# Patient Record
Sex: Male | Born: 1935 | Race: Black or African American | Hispanic: No | Marital: Single | Smoking: Former smoker
Health system: Southern US, Community
[De-identification: ages and names within clinical notes are randomized; demographics above are authoritative.]

## PROBLEM LIST (undated history)

## (undated) DIAGNOSIS — E785 Hyperlipidemia, unspecified: Secondary | ICD-10-CM

## (undated) DIAGNOSIS — H9190 Unspecified hearing loss, unspecified ear: Secondary | ICD-10-CM

## (undated) DIAGNOSIS — I10 Essential (primary) hypertension: Secondary | ICD-10-CM

## (undated) DIAGNOSIS — Z87828 Personal history of other (healed) physical injury and trauma: Secondary | ICD-10-CM

## (undated) DIAGNOSIS — M1990 Unspecified osteoarthritis, unspecified site: Secondary | ICD-10-CM

## (undated) DIAGNOSIS — C801 Malignant (primary) neoplasm, unspecified: Secondary | ICD-10-CM

## (undated) DIAGNOSIS — K219 Gastro-esophageal reflux disease without esophagitis: Secondary | ICD-10-CM

## (undated) HISTORY — PX: PROSTATE SURGERY: SHX751

## (undated) HISTORY — PX: COLONOSCOPY: SHX174

## (undated) HISTORY — DX: Gastro-esophageal reflux disease without esophagitis: K21.9

## (undated) HISTORY — DX: Personal history of other (healed) physical injury and trauma: Z87.828

## (undated) HISTORY — DX: Hyperlipidemia, unspecified: E78.5

## (undated) HISTORY — DX: Unspecified osteoarthritis, unspecified site: M19.90

## (undated) HISTORY — DX: Malignant (primary) neoplasm, unspecified: C80.1

## (undated) HISTORY — DX: Essential (primary) hypertension: I10

## (undated) HISTORY — PX: HERNIA REPAIR: SHX51

---

## 2004-01-24 ENCOUNTER — Encounter: Admission: RE | Admit: 2004-01-24 | Discharge: 2004-01-24 | Payer: Self-pay | Admitting: Internal Medicine

## 2004-02-04 ENCOUNTER — Encounter: Admission: RE | Admit: 2004-02-04 | Discharge: 2004-02-04 | Payer: Self-pay | Admitting: Internal Medicine

## 2004-02-08 ENCOUNTER — Encounter: Admission: RE | Admit: 2004-02-08 | Discharge: 2004-02-08 | Payer: Self-pay | Admitting: Internal Medicine

## 2004-02-22 ENCOUNTER — Encounter: Admission: RE | Admit: 2004-02-22 | Discharge: 2004-02-22 | Payer: Self-pay | Admitting: Internal Medicine

## 2004-04-18 ENCOUNTER — Ambulatory Visit (HOSPITAL_COMMUNITY): Admission: RE | Admit: 2004-04-18 | Discharge: 2004-04-18 | Payer: Self-pay | Admitting: *Deleted

## 2004-04-18 ENCOUNTER — Encounter (INDEPENDENT_AMBULATORY_CARE_PROVIDER_SITE_OTHER): Payer: Self-pay | Admitting: Specialist

## 2004-06-17 ENCOUNTER — Encounter: Admission: RE | Admit: 2004-06-17 | Discharge: 2004-06-17 | Payer: Self-pay | Admitting: Internal Medicine

## 2004-07-11 ENCOUNTER — Ambulatory Visit: Payer: Self-pay | Admitting: Internal Medicine

## 2004-08-07 ENCOUNTER — Ambulatory Visit: Payer: Self-pay | Admitting: Internal Medicine

## 2004-11-26 ENCOUNTER — Ambulatory Visit: Payer: Self-pay | Admitting: Internal Medicine

## 2006-03-03 ENCOUNTER — Ambulatory Visit: Payer: Self-pay | Admitting: Internal Medicine

## 2006-09-22 ENCOUNTER — Emergency Department (HOSPITAL_COMMUNITY): Admission: EM | Admit: 2006-09-22 | Discharge: 2006-09-22 | Payer: Self-pay | Admitting: Family Medicine

## 2007-03-04 ENCOUNTER — Telehealth (INDEPENDENT_AMBULATORY_CARE_PROVIDER_SITE_OTHER): Payer: Self-pay | Admitting: *Deleted

## 2007-03-21 ENCOUNTER — Ambulatory Visit: Payer: Self-pay | Admitting: Internal Medicine

## 2007-03-21 ENCOUNTER — Encounter (INDEPENDENT_AMBULATORY_CARE_PROVIDER_SITE_OTHER): Payer: Self-pay | Admitting: Pulmonary Disease

## 2007-03-21 DIAGNOSIS — I10 Essential (primary) hypertension: Secondary | ICD-10-CM | POA: Insufficient documentation

## 2007-03-21 DIAGNOSIS — M159 Polyosteoarthritis, unspecified: Secondary | ICD-10-CM

## 2007-03-21 DIAGNOSIS — D126 Benign neoplasm of colon, unspecified: Secondary | ICD-10-CM

## 2007-03-21 DIAGNOSIS — E785 Hyperlipidemia, unspecified: Secondary | ICD-10-CM

## 2007-03-21 LAB — CONVERTED CEMR LAB
ALT: 19 units/L (ref 0–53)
AST: 18 units/L (ref 0–37)
Albumin: 4.4 g/dL (ref 3.5–5.2)
Alkaline Phosphatase: 48 units/L (ref 39–117)
BUN: 20 mg/dL (ref 6–23)
CO2: 27 meq/L (ref 19–32)
Calcium: 9.9 mg/dL (ref 8.4–10.5)
Chloride: 105 meq/L (ref 96–112)
Cholesterol: 237 mg/dL — ABNORMAL HIGH (ref 0–200)
Creatinine, Ser: 1.1 mg/dL (ref 0.40–1.50)
Glucose, Bld: 77 mg/dL (ref 70–99)
HCT: 41.6 % (ref 39.0–52.0)
HDL: 49 mg/dL (ref >39)
Hemoglobin: 13.7 g/dL (ref 13.0–17.0)
LDL Cholesterol: 146 mg/dL — ABNORMAL HIGH (ref 0–99)
MCHC: 32.9 g/dL (ref 30.0–36.0)
MCV: 93.1 fL (ref 78.0–100.0)
PSA: 6.36 ng/mL — ABNORMAL HIGH (ref 0.10–4.00)
Platelets: 299 10*3/uL (ref 150–400)
Potassium: 4.8 meq/L (ref 3.5–5.3)
RBC: 4.47 M/uL (ref 4.22–5.81)
RDW: 12.7 % (ref 11.5–14.0)
Sodium: 141 meq/L (ref 135–145)
Total Bilirubin: 0.4 mg/dL (ref 0.3–1.2)
Total CHOL/HDL Ratio: 4.8
Total Protein: 7.8 g/dL (ref 6.0–8.3)
Triglycerides: 210 mg/dL — ABNORMAL HIGH (ref <150)
VLDL: 42 mg/dL — ABNORMAL HIGH (ref 0–40)
WBC: 7.1 10*3/uL (ref 4.0–10.5)

## 2008-03-27 ENCOUNTER — Telehealth (INDEPENDENT_AMBULATORY_CARE_PROVIDER_SITE_OTHER): Payer: Self-pay | Admitting: Pharmacy Technician

## 2008-04-12 ENCOUNTER — Encounter: Payer: Self-pay | Admitting: Internal Medicine

## 2008-04-12 ENCOUNTER — Ambulatory Visit: Payer: Self-pay | Admitting: Internal Medicine

## 2008-04-16 DIAGNOSIS — R972 Elevated prostate specific antigen [PSA]: Secondary | ICD-10-CM

## 2008-04-16 LAB — CONVERTED CEMR LAB
ALT: 25 units/L (ref 0–53)
AST: 24 units/L (ref 0–37)
Albumin: 4.1 g/dL (ref 3.5–5.2)
Alkaline Phosphatase: 45 units/L (ref 39–117)
BUN: 19 mg/dL (ref 6–23)
CO2: 25 meq/L (ref 19–32)
Calcium: 9.4 mg/dL (ref 8.4–10.5)
Chloride: 105 meq/L (ref 96–112)
Cholesterol: 247 mg/dL — ABNORMAL HIGH (ref 0–200)
Creatinine, Ser: 1.02 mg/dL (ref 0.40–1.50)
Glucose, Bld: 81 mg/dL (ref 70–99)
HDL: 54 mg/dL (ref >39)
LDL Cholesterol: 174 mg/dL — ABNORMAL HIGH (ref 0–99)
PSA: 6.82 ng/mL — ABNORMAL HIGH (ref 0.10–4.00)
Potassium: 4.6 meq/L (ref 3.5–5.3)
Sodium: 141 meq/L (ref 135–145)
Total Bilirubin: 0.6 mg/dL (ref 0.3–1.2)
Total CHOL/HDL Ratio: 4.6
Total Protein: 7.3 g/dL (ref 6.0–8.3)
Triglycerides: 95 mg/dL (ref <150)
VLDL: 19 mg/dL (ref 0–40)

## 2009-01-08 ENCOUNTER — Encounter: Payer: Self-pay | Admitting: Internal Medicine

## 2009-01-08 ENCOUNTER — Ambulatory Visit: Payer: Self-pay | Admitting: Internal Medicine

## 2009-01-08 ENCOUNTER — Encounter (INDEPENDENT_AMBULATORY_CARE_PROVIDER_SITE_OTHER): Payer: Self-pay | Admitting: *Deleted

## 2009-01-08 DIAGNOSIS — K4090 Unilateral inguinal hernia, without obstruction or gangrene, not specified as recurrent: Secondary | ICD-10-CM | POA: Insufficient documentation

## 2009-01-08 DIAGNOSIS — R42 Dizziness and giddiness: Secondary | ICD-10-CM | POA: Insufficient documentation

## 2009-01-08 LAB — CONVERTED CEMR LAB
ALT: 33 units/L (ref 0–53)
AST: 25 units/L (ref 0–37)
Albumin: 4.2 g/dL (ref 3.5–5.2)
Alkaline Phosphatase: 50 units/L (ref 39–117)
BUN: 24 mg/dL — ABNORMAL HIGH (ref 6–23)
CO2: 23 meq/L (ref 19–32)
Calcium: 9.7 mg/dL (ref 8.4–10.5)
Chloride: 105 meq/L (ref 96–112)
Creatinine, Ser: 1.04 mg/dL (ref 0.40–1.50)
Glucose, Bld: 75 mg/dL (ref 70–99)
Potassium: 4.5 meq/L (ref 3.5–5.3)
Sodium: 142 meq/L (ref 135–145)
TSH: 1.488 microintl units/mL (ref 0.350–4.500)
Total Bilirubin: 0.5 mg/dL (ref 0.3–1.2)
Total Protein: 7.3 g/dL (ref 6.0–8.3)

## 2009-01-17 ENCOUNTER — Encounter: Admission: RE | Admit: 2009-01-17 | Discharge: 2009-01-17 | Payer: Self-pay | Admitting: Surgery

## 2009-01-21 ENCOUNTER — Encounter: Payer: Self-pay | Admitting: Internal Medicine

## 2009-02-06 ENCOUNTER — Encounter: Payer: Self-pay | Admitting: Internal Medicine

## 2009-03-13 ENCOUNTER — Encounter: Payer: Self-pay | Admitting: Internal Medicine

## 2009-06-11 ENCOUNTER — Ambulatory Visit: Payer: Self-pay | Admitting: Internal Medicine

## 2009-06-11 DIAGNOSIS — J309 Allergic rhinitis, unspecified: Secondary | ICD-10-CM | POA: Insufficient documentation

## 2009-06-11 LAB — CONVERTED CEMR LAB
ALT: 26 units/L (ref 0–53)
AST: 22 units/L (ref 0–37)
Albumin: 4.2 g/dL (ref 3.5–5.2)
Alkaline Phosphatase: 44 units/L (ref 39–117)
BUN: 18 mg/dL (ref 6–23)
CO2: 25 meq/L (ref 19–32)
Calcium: 9.3 mg/dL (ref 8.4–10.5)
Chloride: 104 meq/L (ref 96–112)
Cholesterol: 226 mg/dL — ABNORMAL HIGH (ref 0–200)
Creatinine, Ser: 1.19 mg/dL (ref 0.40–1.50)
Glucose, Bld: 65 mg/dL — ABNORMAL LOW (ref 70–99)
HDL: 57 mg/dL (ref >39)
LDL Cholesterol: 146 mg/dL — ABNORMAL HIGH (ref 0–99)
Potassium: 4.3 meq/L (ref 3.5–5.3)
Sodium: 141 meq/L (ref 135–145)
Total Bilirubin: 0.5 mg/dL (ref 0.3–1.2)
Total CHOL/HDL Ratio: 4
Total Protein: 6.9 g/dL (ref 6.0–8.3)
Triglycerides: 115 mg/dL (ref <150)
VLDL: 23 mg/dL (ref 0–40)

## 2009-11-13 ENCOUNTER — Ambulatory Visit: Admission: RE | Admit: 2009-11-13 | Discharge: 2010-02-11 | Payer: Self-pay | Admitting: Radiation Oncology

## 2009-12-04 ENCOUNTER — Telehealth: Payer: Self-pay | Admitting: Internal Medicine

## 2009-12-19 ENCOUNTER — Ambulatory Visit: Payer: Self-pay | Admitting: Internal Medicine

## 2009-12-19 ENCOUNTER — Ambulatory Visit (HOSPITAL_COMMUNITY): Admission: RE | Admit: 2009-12-19 | Discharge: 2009-12-19 | Payer: Self-pay | Admitting: Internal Medicine

## 2009-12-19 DIAGNOSIS — M549 Dorsalgia, unspecified: Secondary | ICD-10-CM | POA: Insufficient documentation

## 2009-12-19 LAB — CONVERTED CEMR LAB
ALT: 15 units/L (ref 0–53)
AST: 15 units/L (ref 0–37)
Albumin: 4.3 g/dL (ref 3.5–5.2)
Alkaline Phosphatase: 44 units/L (ref 39–117)
BUN: 15 mg/dL (ref 6–23)
Bacteria, UA: NONE SEEN
Bilirubin Urine: NEGATIVE
CO2: 28 meq/L (ref 19–32)
Calcium: 9.2 mg/dL (ref 8.4–10.5)
Casts: NONE SEEN /lpf
Chloride: 103 meq/L (ref 96–112)
Cholesterol, target level: 200 mg/dL
Cholesterol: 188 mg/dL (ref 0–200)
Creatinine, Ser: 1.25 mg/dL (ref 0.40–1.50)
Crystals: NONE SEEN
Glucose, Bld: 86 mg/dL (ref 70–99)
HDL goal, serum: 40 mg/dL
HDL: 54 mg/dL (ref >39)
Hemoglobin, Urine: NEGATIVE
Ketones, ur: NEGATIVE mg/dL
LDL Cholesterol: 114 mg/dL — ABNORMAL HIGH (ref 0–99)
LDL Goal: 100 mg/dL
Nitrite: NEGATIVE
Potassium: 4.1 meq/L (ref 3.5–5.3)
Protein, ur: NEGATIVE mg/dL
RBC / HPF: NONE SEEN (ref <3)
Sodium: 141 meq/L (ref 135–145)
Specific Gravity, Urine: 1.01 (ref 1.005–1.0)
Squamous Epithelial / HPF: NONE SEEN /lpf
Total Bilirubin: 0.5 mg/dL (ref 0.3–1.2)
Total CHOL/HDL Ratio: 3.5
Total Protein: 7.2 g/dL (ref 6.0–8.3)
Triglycerides: 99 mg/dL (ref <150)
Urine Glucose: NEGATIVE mg/dL
Urobilinogen, UA: 0.2 (ref 0.0–1.0)
VLDL: 20 mg/dL (ref 0–40)
pH: 6 (ref 5.0–8.0)

## 2010-02-12 ENCOUNTER — Ambulatory Visit: Admission: RE | Admit: 2010-02-12 | Discharge: 2010-04-04 | Payer: Self-pay | Admitting: Radiation Oncology

## 2010-04-29 ENCOUNTER — Telehealth: Payer: Self-pay | Admitting: Internal Medicine

## 2010-05-09 ENCOUNTER — Encounter: Payer: Self-pay | Admitting: Internal Medicine

## 2010-05-09 ENCOUNTER — Telehealth: Payer: Self-pay | Admitting: Internal Medicine

## 2010-06-30 ENCOUNTER — Telehealth: Payer: Self-pay | Admitting: Internal Medicine

## 2010-08-19 ENCOUNTER — Telehealth: Payer: Self-pay | Admitting: Internal Medicine

## 2010-10-20 ENCOUNTER — Telehealth: Payer: Self-pay | Admitting: Internal Medicine

## 2010-10-21 ENCOUNTER — Emergency Department (HOSPITAL_COMMUNITY)
Admission: EM | Admit: 2010-10-21 | Discharge: 2010-10-21 | Payer: Self-pay | Source: Home / Self Care | Admitting: Family Medicine

## 2010-11-20 ENCOUNTER — Ambulatory Visit (HOSPITAL_COMMUNITY)
Admission: RE | Admit: 2010-11-20 | Discharge: 2010-11-20 | Payer: Self-pay | Source: Home / Self Care | Attending: Gastroenterology | Admitting: Gastroenterology

## 2010-11-21 NOTE — Op Note (Signed)
  NAMEMARCELL, [name]                 ACCOUNT NO.:  [id number]  MEDICAL RECORD NO.:  [id number]          PATIENT TYPE:  AMB  LOCATION:  ENDO                         FACILITY:  [facility]  PHYSICIAN:  Shirley Friar, MDDATE OF BIRTH:  [birth year]  DATE OF PROCEDURE:  11/20/2010 DATE OF DISCHARGE:  11/20/2010                              OPERATIVE REPORT   INDICATION:  Rectal bleeding.  MEDICATIONS:  Fentanyl 75 mcg IV, Versed 6 mg IV.  FINDINGS:  Rectal exam was unremarkable.  A pediatric colonoscope was inserted into the rectum where diffuse telangiectasias were seen with oozing of blood upon insertion.  The colonoscope was advanced into the sigmoid colon, which was unremarkable.  The telangiectasias in the rectum were again visualized on pull back into the rectum consistent with radiation proctitis.  Argon plasma coagulation was then used to fulgurate multiple areas of telangiectasias with complete hemostasis. The colonoscope was withdrawn to confirm above findings.  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  PLAN: 1. Avoid NSAIDs. 2. Consider repeat sigmoidoscopy with argon plasma coagulation if     bleeding recurs.     Shirley Friar, MD     VCS/MEDQ  D:  11/20/2010  T:  11/21/2010  Job:  [id number]  cc:   Lyn Hollingshead, MD  Electronically Signed by Charlott Rakes MD on 11/21/2010 10:48:56 AM

## 2010-11-24 ENCOUNTER — Encounter: Payer: Self-pay | Admitting: Urology

## 2010-12-02 NOTE — Progress Notes (Signed)
Summary: refill/ hla  Phone Note Refill Request Message from:  Fax from Pharmacy on December 04, 2009 12:13 PM  Refills Requested: Medication #1:  LISINOPRIL 10 MG TABS Take 1 tablet by mouth once a day   Last Refilled: 12/28 Initial call taken by: Marin Roberts RN,  December 04, 2009 12:14 PM  Follow-up for Phone Call        Refill approved-nurse to complete    Prescriptions: LISINOPRIL 10 MG TABS (LISINOPRIL) Take 1 tablet by mouth once a day  #31 x 5   Entered and Authorized by:   Vassie Loll MD   Signed by:   Vassie Loll MD on 12/04/2009   Method used:   Electronically to        [facility] [id number] (retail)       [address] [place], [place]  [address]       Ph: [phone]       Fax: [phone]   RxID:   [id number]

## 2010-12-02 NOTE — Progress Notes (Signed)
Summary: refill/ hla  Phone Note Refill Request Message from:  Pharmacy on April 29, 2010 3:18 PM  Refills Requested: Medication #1:  PRAVACHOL 20 MG TABS Take 1 tablet by mouth once a day   Dosage confirmed as above?Dosage Confirmed   Last Refilled: 5/11 last visit and labs 12/2009  Initial call taken by: Marin Roberts RN,  April 29, 2010 3:19 PM    Prescriptions: PRAVACHOL 20 MG TABS (PRAVASTATIN SODIUM) Take 1 tablet by mouth once a day  #30 Tablet x 4   Entered by:   Zoila Shutter MD   Authorized by:   Vassie Loll MD   Signed by:   Zoila Shutter MD on 04/29/2010   Method used:   Electronically to        [facility] [id number] (retail)       [address] [place], [place]  [address]       Ph: [phone]       Fax: [phone]   RxID:   [id number]

## 2010-12-02 NOTE — Progress Notes (Signed)
Summary: med refill/gp  Phone Note Refill Request Message from:  Fax from Pharmacy on August 19, 2010 11:05 AM  Refills Requested: Medication #1:  NORVASC 10 MG TABS take 1 tablet o once daily   Last Refilled: 07/11/2010 Last appt. w/labs 12/19/09    Method Requested: Electronic Initial call taken by: Chinita Pester RN,  August 19, 2010 11:05 AM  Follow-up for Phone Call        Refill approved-nurse to complete    Prescriptions: NORVASC 10 MG TABS (AMLODIPINE BESYLATE) take 1 tablet o once daily  #30 x 5   Entered and Authorized by:   Vassie Loll MD   Signed by:   Vassie Loll MD on 08/19/2010   Method used:   Electronically to        [facility] [id number] (retail)       [address] [place], [place]  [address]       Ph: [phone]       Fax: [phone]   RxID:   [id number]

## 2010-12-02 NOTE — Medication Information (Signed)
Summary: Prescription Solution: Denial Notice  Prescription Solution: Denial Notice   Imported By: Shon Hough 05/23/2010 16:47:07  _____________________________________________________________________  External Attachment:    Type:   Image     Comment:   External Document

## 2010-12-02 NOTE — Progress Notes (Signed)
Summary: Prior Authorization- Flector patches  Phone Note Outgoing Call   Call placed by: Angelina Ok RN,  May 09, 2010 3:02 PM Call placed to: Specialist Summary of Call: Call for Prior Authorization of Flector 1.3% patches.  Information given.  Faxed response is expected from the  Insurer. Angelina Ok RN  May 09, 2010 3:03 PM                      Initial call taken by: Angelina Ok RN,  May 09, 2010 3:04 PM  Follow-up for Phone Call        Flector has been denied by medicare. paperwork is in your box for review. Follow-up by: Merrie Roof RN,  May 12, 2010 3:01 PM  Additional Follow-up for Phone Call Additional follow up Details #1::        I will review paperwork and if needed will change his flector patches tu lidocaine patchs or diclofenac patches if approved by medicare and patient continue to be in pain.

## 2010-12-02 NOTE — Progress Notes (Signed)
Summary: Refill/gh  Phone Note Refill Request Message from:  Fax from Pharmacy on June 30, 2010 4:15 PM  Refills Requested: Medication #1:  LISINOPRIL 10 MG TABS Take 1 tablet by mouth once a day   Last Refilled: 05/24/2010  Method Requested: Electronic Initial call taken by: Angelina Ok RN,  June 30, 2010 4:16 PM  Follow-up for Phone Call        Refill approved-nurse to complete    Prescriptions: LISINOPRIL 10 MG TABS (LISINOPRIL) Take 1 tablet by mouth once a day  #31 x 5   Entered and Authorized by:   Vassie Loll MD   Signed by:   Vassie Loll MD on 07/01/2010   Method used:   Electronically to        [facility] [id number] (retail)       [address] [place], [place]  [address]       Ph: [phone]       Fax: [phone]   RxID:   [id number]

## 2010-12-02 NOTE — Assessment & Plan Note (Signed)
Summary: est-back apin/cfb   Vital Signs:  Patient profile:   76 year old male Height:      71.5 inches (181.61 cm) Weight:      179.6 pounds (81.64 kg) BMI:     24.79 Temp:     97.5 degrees F (36.39 degrees C) oral Pulse rate:   74 / minute BP sitting:   136 / 82  (right arm)  Vitals Entered By: Clifford Kidney Ditzler RN (December 19, 2009 10:06 AM) CC: Lipid Management Is Patient Diabetic? No Pain Assessment Patient in pain? yes     Location: left low back Intensity: 10 Type: pain Onset of pain  past 3 days Nutritional Status BMI of 19 -24 = normal Nutritional Status Detail appetite good  Have you ever been in a relationship where you felt threatened, hurt or afraid?denies   Does patient need assistance? Functional Status Self care Ambulation Normal Comments Left low back pain past 3 days - worse with movement. Freq urination recently.   Primary Care Provider:  Vassie Loll MD  CC:  Lipid Management.  History of Present Illness: [name] is a pleasant  76 yo man with PMH of HTN, prostate nodule, inguinal hernia and HLD who presents for regular check-up and to get refill on his BP medications. Pt reports feeling great in general and complaints only of back pain since monday; pain is localized in left lower back, aggravated by movement, relieved by rest,no radiated, no fever and no chills associated. Patient reports noticing increased urination as well, but denies hematuria and dysuria.  Pt following Dr. Vonita Moss regarding elevated PSA and prostate cancer; he is going to start radiation therapy next month.  Regarding his inguinal hernia, he is going to schedule surgery repairment; he has been procrastinating in his hernia repair, because is not causing any pain at this pont.  Pt denies any dizziness, CP, SOB, abdominal pain or any other complaints at this time.  BP is doing great 136/82.  Depression History:      The patient denies a depressed mood most of the day and a  diminished interest in his usual daily activities.        The patient denies that he feels like life is not worth living, denies that he wishes that he were dead, and denies that he has thought about ending his life.         Lipid Management History:      Positive NCEP/ATP III risk factors include male age 74 years old or older and hypertension.  Negative NCEP/ATP III risk factors include no family history for ischemic heart disease, non-tobacco-user status, no ASHD (atherosclerotic heart disease), no prior stroke/TIA, no peripheral vascular disease, and no history of aortic aneurysm.      Preventive Screening-Counseling & Management  Alcohol-Tobacco     Smoking Status: quit     Year Quit: 20 years  Problems Prior to Update: 1)  Back Pain  (ICD-724.5) 2)  Allergic Rhinitis  (ICD-477.9) 3)  Dizziness  (ICD-780.4) 4)  Inguinal Hernia, Left  (ICD-550.90) 5)  Elevated Prostate Specific Antigen  (ICD-790.93) 6)  Screening For Malignant Neoplasm, Prostate  (ICD-V76.44) 7)  Aftercare, Long-term Use, Medications Nec  (ICD-V58.69) 8)  Colonic Polyps  (ICD-211.3) 9)  Osteoarthrosis, Generalized, Multiple Sites  (ICD-715.09) 10)  Hyperlipidemia  (ICD-272.4) 11)  Essential Hypertension  (ICD-401.9)  Current Problems (verified): 1)  Allergic Rhinitis  (ICD-477.9) 2)  Dizziness  (ICD-780.4) 3)  Inguinal Hernia, Left  (ICD-550.90) 4)  Elevated Prostate  Specific Antigen  (ICD-790.93) 5)  Screening For Malignant Neoplasm, Prostate  (ICD-V76.44) 6)  Aftercare, Long-term Use, Medications Nec  (ICD-V58.69) 7)  Colonic Polyps  (ICD-211.3) 8)  Osteoarthrosis, Generalized, Multiple Sites  (ICD-715.09) 9)  Hyperlipidemia  (ICD-272.4) 10)  Essential Hypertension  (ICD-401.9)  Medications Prior to Update: 1)  Norvasc 10 Mg Tabs (Amlodipine Besylate) .... Take 1 Tablet O Once Daily 2)  Ibuprofen 200 Mg Tabs (Ibuprofen) .... Take 1 Tablet By Mouth Three Times A Day As Needed For Knee and Back Pain 3)   Aspirin 81 Mg Tbec (Aspirin) .... Take 1 Tablet By Mouth Once Daily 4)  Lisinopril 10 Mg Tabs (Lisinopril) .... Take 1 Tablet By Mouth Once A Day 5)  Pravachol 20 Mg Tabs (Pravastatin Sodium) .... Take 1 Tablet By Mouth Once A Day 6)  Loratadine 10 Mg Tabs (Loratadine) .... Take 1 Tablet By Mouth Once A Day  Current Medications (verified): 1)  Norvasc 10 Mg Tabs (Amlodipine Besylate) .... Take 1 Tablet O Once Daily 2)  Aspirin 81 Mg Tbec (Aspirin) .... Take 1 Tablet By Mouth Once Daily 3)  Lisinopril 10 Mg Tabs (Lisinopril) .... Take 1 Tablet By Mouth Once A Day 4)  Pravachol 20 Mg Tabs (Pravastatin Sodium) .... Take 1 Tablet By Mouth Once A Day 5)  Loratadine 10 Mg Tabs (Loratadine) .... Take 1 Tablet By Mouth Once A Day  Allergies (verified): No Known Drug Allergies  Past History:  Past Medical History: Last updated: 04/12/2008 HTN HLD Osteoarthrosis  Social History: Last updated: 04/12/2008 Retired Single Former Smoker Alcohol use-no Drug use-no  Risk Factors: Smoking Status: quit (12/19/2009)  Review of Systems       As per HPi.  Physical Exam  General:  Well-developed,well-nourished, in no acute distress; alert, appropriate and cooperative throughout examination. Lungs:  normal respiratory effort, no crackles, and no wheezes.   Heart:  normal rate, regular rhythm, no murmur, no gallop, and no rub.   Abdomen:  Bowel sounds positive, abdomen soft and non-tender without masses and organomegaly. Msk:  Left thoracic back tenderness with palpation; negative SLR test bilaterally; no crepitation; no joint swelling. Extremities:  No clubbing, cyanosis, edema, or deformity noted. Neurologic:  alert & oriented X3 and cranial nerves II-XII fully intact.  strength normal in all extremities, sensation intact to light touch, sensation intact to pinprick, and finger-to-nose normal.     Impression & Recommendations:  Problem # 1:  BACK PAIN (ICD-724.5) Patient back pain most  likely secondary to muscle sprain; because of his hx of prostate cancer an x-ray (which was negative) was done to r/o metastasis and at the same time r/o any fractures. Also checked urine to r/o UTI. conservative treatment wasinitidatedwith diclofenac and flexeril; I have discussed use of moist heat or ice, modified activities, medications, and stretching/strengthening exercises. Back care instructions given. To be seen in 2 weeks if no improvement; sooner if worsening of symptoms.   The following medications were removed from the medication list:    Ibuprofen 200 Mg Tabs (Ibuprofen) .Marland Kitchen... Take 1 tablet by mouth three times a day as needed for knee and back pain His updated medication list for this problem includes:    Aspirin 81 Mg Tbec (Aspirin) .Marland Kitchen... Take 1 tablet by mouth once daily    Diclofenac Sodium 75 Mg Tbec (Diclofenac sodium) .Marland Kitchen... Take 1 tablet by mouth two times a day    Flexeril 5 Mg Tabs (Cyclobenzaprine hcl) .Marland Kitchen... Take 1 tab by mouth at bedtime  Orders:  Diagnostic X-Ray/Fluoroscopy (Diagnostic X-Ray/Flu) Diagnostic X-Ray/Fluoroscopy (Diagnostic X-Ray/Flu) T-Culture, Urine ([id number]) T-Urinalysis ([id number])  Problem # 2:  ELEVATED PROSTATE SPECIFIC ANTIGEN (ICD-790.93) Patient will received radiotherapy for his prostate cancer and will continue close followup by his urologist Dr. Vonita Moss.  Problem # 3:  ESSENTIAL HYPERTENSION (ICD-401.9) At goal and well controlled. Will continue current regimen and will advised to follow a low sodium diet. Renal function and electrolytes were checked and were WNL.  His updated medication list for this problem includes:    Norvasc 10 Mg Tabs (Amlodipine besylate) .Marland Kitchen... Take 1 tablet o once daily    Lisinopril 10 Mg Tabs (Lisinopril) .Marland Kitchen... Take 1 tablet by mouth once a day  BP today: 136/82 Prior BP: 145/80 (06/11/2009)  10 Yr Risk Heart Disease: 22 %  Labs Reviewed: K+: 4.3 (06/11/2009) Creat: : 1.19 (06/11/2009)   Chol: 226  (06/11/2009)   HDL: 57 (06/11/2009)   LDL: 146 (06/11/2009)   TG: 115 (06/11/2009)  Problem # 4:  HYPERLIPIDEMIA (ICD-272.4) Lipid profile was done and demonstrated LDL of 114; will continue pravachol same dose and will advised patient to continue low fat diet. LFT's WNL.  His updated medication list for this problem includes:    Pravachol 20 Mg Tabs (Pravastatin sodium) .Marland Kitchen... Take 1 tablet by mouth once a day  Orders: T-Lipid Profile [phone])  Labs Reviewed: SGOT: 22 (06/11/2009)   SGPT: 26 (06/11/2009)  Lipid Goals: Chol Goal: 200 (12/19/2009)   HDL Goal: 40 (12/19/2009)   LDL Goal: 100 (12/19/2009)   TG Goal: 150 (12/19/2009)  10 Yr Risk Heart Disease: 22 %   HDL:57 (06/11/2009), 54 (04/12/2008)  LDL:146 (06/11/2009), 174 (04/12/2008)  Chol:226 (06/11/2009), 247 (04/12/2008)  Trig:115 (06/11/2009), 95 (04/12/2008)  Complete Medication List: 1)  Norvasc 10 Mg Tabs (Amlodipine besylate) .... Take 1 tablet o once daily 2)  Aspirin 81 Mg Tbec (Aspirin) .... Take 1 tablet by mouth once daily 3)  Lisinopril 10 Mg Tabs (Lisinopril) .... Take 1 tablet by mouth once a day 4)  Pravachol 20 Mg Tabs (Pravastatin sodium) .... Take 1 tablet by mouth once a day 5)  Loratadine 10 Mg Tabs (Loratadine) .... Take 1 tablet by mouth once a day 6)  Diclofenac Sodium 75 Mg Tbec (Diclofenac sodium) .... Take 1 tablet by mouth two times a day 7)  Flexeril 5 Mg Tabs (Cyclobenzaprine hcl) .... Take 1 tab by mouth at bedtime 8)  Flector 1.3 % Ptch (Diclofenac epolamine) .... Apply 1 patch in the affected area two times a day  Other Orders: Influenza Vaccine MCR ([address]) Tdap => 63yrs IM ([address]) T-Comprehensive Metabolic Panel ([id number])  Lipid Assessment/Plan:      Based on NCEP/ATP III, the patient's risk factor category is "0-1 risk factors".  The patient's lipid goals are as follows: Total cholesterol goal is 200; LDL cholesterol goal is 100; HDL cholesterol goal is 40; Triglyceride goal is 150.     Patient Instructions: 1)  Please schedule a follow-up appointment in 3 months. 2)  Remember to be compliant with your appointment at [facility] with Dr. Vonita Moss. 3)  Limit your Sodium (less than 2 grams daily). 4)  Take your medications as prescribed. 5)  Most patients (90%) with low back pain will improve with time (2-6 weeks). Keep active but avoid activities that are painful. Apply moist heat and/or ice to lower back several times a day. 6)  You will be called with any abnormalities in the tests scheduled or performed today.  If you don't  hear from [place] within a week from when the test was performed, you can assume that your test was normal. Prescriptions: NORVASC 10 MG TABS (AMLODIPINE BESYLATE) take 1 tablet o once daily  #30 x 5   Entered and Authorized by:   Vassie Loll MD   Signed by:   Vassie Loll MD on 12/19/2009   Method used:   Electronically to        [facility] [address] (retail)       [address] [place], [place]  [address]       Ph: [phone]       Fax: [phone]   RxID:   [id number] FLECTOR 1.3 % PTCH (DICLOFENAC EPOLAMINE) Apply 1 patch in the affected area two times a day  #1 x 1   Entered and Authorized by:   Vassie Loll MD   Signed by:   Vassie Loll MD on 12/19/2009   Method used:   Electronically to        [facility] [address] (retail)       [address]       [place], [place]  [address]       Ph: [phone]       Fax: [phone]   RxID:   [id number] FLEXERIL 5 MG TABS (CYCLOBENZAPRINE HCL) Take 1 tab by mouth at bedtime  #15 x 0   Entered and Authorized by:   Vassie Loll MD   Signed by:   Vassie Loll MD on 12/19/2009   Method used:   Electronically to        [facility] [address] (retail)       [address]       [place], [place]  [address]       Ph: [phone]       Fax: [phone]   RxID:    [id number] DICLOFENAC SODIUM 75 MG TBEC (DICLOFENAC SODIUM) Take 1 tablet by mouth two times a day  #30 x 0   Entered and Authorized by:   Vassie Loll MD   Signed by:   Vassie Loll MD on 12/19/2009   Method used:   Electronically to        [facility] [address] (retail)       [address] [place], [place]  [address]       Ph: [phone]       Fax: [phone]   RxID:   [id number]  Process Orders Check Orders Results:     [facility]: Check successful Tests Sent for requisitioning (December 24, 2009 8:14 AM):     12/19/2009: [facility] -- T-Culture, Urine [[id number]] (signed)     12/19/2009: [facility] -- T-Lipid Profile [phone] (signed)     12/19/2009: [facility] -- T-Comprehensive Metabolic Panel [[id number]] (signed)     12/19/2009: [facility] -- T-Urinalysis [[id number]] (signed)    Prevention & Chronic Care Immunizations   Influenza vaccine: Fluvax MCR  (12/19/2009)   Influenza vaccine deferral: Deferred  (06/11/2009)   Influenza vaccine due: 07/03/2010    Tetanus booster: 12/19/2009: Tdap   Td booster deferral: Deferred  (06/11/2009)   Tetanus booster due: 12/20/2019  Pneumococcal vaccine: Not documented   Pneumococcal vaccine deferral: Deferred  (06/11/2009)    H. zoster vaccine: Not documented   H. zoster vaccine deferral: Deferred  (06/11/2009)  Colorectal Screening   Hemoccult: Not documented   Hemoccult action/deferral: Ordered  (06/11/2009)   Hemoccult due: 06/11/2010    Colonoscopy: Not documented   Colonoscopy action/deferral: Deferred  (12/19/2009)  Other Screening   PSA: 6.82  (04/12/2008)  Reports requested:   Last colonoscopy report requested.  Smoking status: quit  (12/19/2009)  Lipids   Total Cholesterol: 226  (06/11/2009)   Lipid panel action/deferral: Lipid Panel ordered   LDL: 146   (06/11/2009)   LDL Direct: Not documented   HDL: 57  (06/11/2009)   Triglycerides: 115  (06/11/2009)    SGOT (AST): 22  (06/11/2009)   BMP action: Ordered   SGPT (ALT): 26  (06/11/2009) CMP ordered    Alkaline phosphatase: 44  (06/11/2009)   Total bilirubin: 0.5  (06/11/2009)    Lipid flowsheet reviewed?: Yes   Progress toward LDL goal: Improved  Hypertension   Last Blood Pressure: 136 / 82  (12/19/2009)   Serum creatinine: 1.19  (06/11/2009)   BMP action: Ordered   Serum potassium 4.3  (06/11/2009) CMP ordered     Hypertension flowsheet reviewed?: Yes   Progress toward BP goal: Improved  Self-Management Support :   Personal Goals (by the next clinic visit) :      Personal blood pressure goal: 140/90  (12/19/2009)     Personal LDL goal: 100  (12/19/2009)    Patient will work on the following items until the next clinic visit to reach self-care goals:     Medications and monitoring: take my medicines every day, check my blood pressure, bring all of my medications to every visit  (12/19/2009)     Eating: drink diet soda or water instead of juice or soda, eat more vegetables, use fresh or frozen vegetables, eat foods that are low in salt, eat baked foods instead of fried foods, eat fruit for snacks and desserts, limit or avoid alcohol  (12/19/2009)     Activity: take a 30 minute walk every day, park at the far end of the parking lot  (12/19/2009)    Hypertension self-management support: Written self-care plan  (12/19/2009)   Hypertension self-care plan printed.    Lipid self-management support: Written self-care plan  (12/19/2009)   Lipid self-care plan printed.   Nursing Instructions: Give Flu vaccine today Give tetanus booster today Request report of last colonoscopy    Tetanus/Td Vaccine    Vaccine Type: Tdap    Site: right deltoid    Mfr: GlaxoSmithKline    Dose: 0.5 ml    Route: IM    Given by: Clifford Kidney Ditzler RN    Exp. Date: 12/28/2011    Lot #:  [id number]    VIS given: 09/20/07 version given December 19, 2009.  Influenza Vaccine    Vaccine Type: Fluvax MCR    Site: left deltoid    Mfr: novartis    Dose: 0.5 ml    Route: IM    Given by: Clifford Kidney Ditzler RN    Exp. Date: 12/31/2009    Lot #: [id number]    VIS given: 05/26/07 version given December 19, 2009.  Flu Vaccine Consent Questions    Do you have a history of severe allergic reactions to this vaccine? no    Any prior history of allergic reactions to egg and/or gelatin? no    Do you have  a sensitivity to the preservative Thimersol? no    Do you have a past history of Guillan-Barre Syndrome? no    Do you currently have an acute febrile illness? no    Have you ever had a severe reaction to latex? no    Vaccine information given and explained to patient? yes

## 2010-12-04 NOTE — Progress Notes (Signed)
Summary: Blood in urine  Phone Note Call from Patient   Caller: Patient Call For: Lyn Hollingshead Summary of Call: Call from pt'sgirlfriend pt is having some blood in his urine.  had blood in his stool recently.  Small amount.  P t's girlfriend was advised to take pt to Urgent Care today. No appointments in the clinics were available today.  Girlfriend agreed to take pt to the Urgent care for the blood in his urine and stool last week.  Once seen pt will get a follow up appointment in the Clinics. Angelina Ok RN  October 20, 2010 4:41 PM  Initial call taken by: Angelina Ok RN,  October 20, 2010 4:41 PM  Follow-up for Phone Call        agree needs eval. Follow-up by: Julaine Fusi  DO,  October 21, 2010 4:34 PM

## 2010-12-17 ENCOUNTER — Ambulatory Visit (HOSPITAL_COMMUNITY)
Admission: RE | Admit: 2010-12-17 | Discharge: 2010-12-17 | Disposition: A | Discharge status: Home / Self Care | Payer: PRIVATE HEALTH INSURANCE | Source: Ambulatory Visit | Attending: Gastroenterology | Admitting: Gastroenterology

## 2010-12-17 DIAGNOSIS — K625 Hemorrhage of anus and rectum: Secondary | ICD-10-CM | POA: Insufficient documentation

## 2010-12-17 DIAGNOSIS — K6289 Other specified diseases of anus and rectum: Secondary | ICD-10-CM | POA: Insufficient documentation

## 2010-12-17 DIAGNOSIS — Y842 Radiological procedure and radiotherapy as the cause of abnormal reaction of the patient, or of later complication, without mention of misadventure at the time of the procedure: Secondary | ICD-10-CM | POA: Insufficient documentation

## 2010-12-17 DIAGNOSIS — E785 Hyperlipidemia, unspecified: Secondary | ICD-10-CM | POA: Insufficient documentation

## 2010-12-17 DIAGNOSIS — I789 Disease of capillaries, unspecified: Secondary | ICD-10-CM | POA: Insufficient documentation

## 2010-12-17 DIAGNOSIS — I10 Essential (primary) hypertension: Secondary | ICD-10-CM | POA: Insufficient documentation

## 2010-12-17 DIAGNOSIS — Z8546 Personal history of malignant neoplasm of prostate: Secondary | ICD-10-CM | POA: Insufficient documentation

## 2011-01-12 LAB — POCT I-STAT, CHEM 8
BUN: 19 mg/dL (ref 6–23)
Calcium, Ion: 1.25 mmol/L (ref 1.12–1.32)
Chloride: 105 mEq/L (ref 96–112)
Creatinine, Ser: 1.4 mg/dL (ref 0.4–1.5)
Glucose, Bld: 87 mg/dL (ref 70–99)
HCT: 41 % (ref 39.0–52.0)
Hemoglobin: 13.9 g/dL (ref 13.0–17.0)
Potassium: 4.3 mEq/L (ref 3.5–5.1)
Sodium: 142 mEq/L (ref 135–145)
TCO2: 30 mmol/L (ref 0–100)

## 2011-01-12 LAB — POCT URINALYSIS DIPSTICK
Bilirubin Urine: NEGATIVE
Glucose, UA: NEGATIVE mg/dL
Ketones, ur: NEGATIVE mg/dL
Nitrite: NEGATIVE
Protein, ur: NEGATIVE mg/dL
Specific Gravity, Urine: 1.02 (ref 1.005–1.030)
Urobilinogen, UA: 1 mg/dL (ref 0.0–1.0)
pH: 6.5 (ref 5.0–8.0)

## 2011-01-12 LAB — HEMOCCULT GUIAC POC 1CARD (OFFICE): Fecal Occult Bld: POSITIVE

## 2011-01-14 ENCOUNTER — Encounter: Payer: Self-pay | Admitting: Internal Medicine

## 2011-01-20 NOTE — Op Note (Signed)
  [name], [name] NO.:  [id number]  MEDICAL RECORD NO.:  [id number]           PATIENT TYPE:  O  LOCATION:  [facility]                         FACILITY:  [facility]  PHYSICIAN:  Shirley Friar, MDDATE OF BIRTH:  [birth year]  DATE OF PROCEDURE: DATE OF DISCHARGE:  12/17/2010                              OPERATIVE REPORT   PROCEDURE:  Flexible sigmoidoscopy.  INDICATIONS:  Rectal bleeding, history of radiation proctitis, argon plasma coagulation in January, who has had recent recurrence of rectal bleeding.  MEDICATIONS:  Fentanyl 87.5 mcg IV and Versed 8 mg IV.  FINDINGS:  Rectal exam was unremarkable.  A pediatric colonoscope was inserted into an adequately prepped rectum and advanced to the sigmoid colon.  Upon insertion in the rectum, there were scattered areas of superficial bleeding with scattered telangiectasias.  The appearance was less inflamed than it was in January.  Argon plasma coagulation was used to fulgurate the proctitis areas with focus on the areas, that were friable and easily caused bleeding.  A straight fire tip APC probe was initially used and this was changed to a circumferential tip based on the location of the telangiectasias.  Adequate fulguration was done of the radiation proctitis.  PLAN: 1. Avoid NSAIDs. 2. Follow up as needed.  If bleeding recurs, he may need a series of     repeated APC sessions depending on his symptoms.     Shirley Friar, MD     VCS/MEDQ  D:  12/17/2010  T:  12/18/2010  Job:  [id number]  cc:   Dr. Donetta Potts  Electronically Signed by Charlott Rakes MD on 01/20/2011 02:57:36 PM

## 2011-01-21 ENCOUNTER — Other Ambulatory Visit: Payer: Self-pay | Admitting: *Deleted

## 2011-01-21 MED ORDER — LISINOPRIL 10 MG PO TABS: 10.0000 mg | ORAL_TABLET | Freq: Every day | ORAL | Status: DC

## 2011-01-21 NOTE — Telephone Encounter (Signed)
As far as I can tell, this patient has not been seen in over 1 year and has no labs or appointments pending. What is his status with [place]?

## 2011-01-21 NOTE — Telephone Encounter (Signed)
He is still a pt here at the clinic; I will send Chilon a message to schedule him for an appt.

## 2011-01-27 ENCOUNTER — Encounter: Payer: Self-pay | Admitting: Internal Medicine

## 2011-01-27 ENCOUNTER — Ambulatory Visit (INDEPENDENT_AMBULATORY_CARE_PROVIDER_SITE_OTHER): Payer: PRIVATE HEALTH INSURANCE | Admitting: Internal Medicine

## 2011-01-27 DIAGNOSIS — C61 Malignant neoplasm of prostate: Secondary | ICD-10-CM | POA: Insufficient documentation

## 2011-01-27 DIAGNOSIS — Z8546 Personal history of malignant neoplasm of prostate: Secondary | ICD-10-CM | POA: Insufficient documentation

## 2011-01-27 DIAGNOSIS — I10 Essential (primary) hypertension: Secondary | ICD-10-CM

## 2011-01-27 DIAGNOSIS — K4090 Unilateral inguinal hernia, without obstruction or gangrene, not specified as recurrent: Secondary | ICD-10-CM

## 2011-01-27 DIAGNOSIS — E785 Hyperlipidemia, unspecified: Secondary | ICD-10-CM

## 2011-01-27 DIAGNOSIS — D126 Benign neoplasm of colon, unspecified: Secondary | ICD-10-CM

## 2011-01-27 LAB — COMPREHENSIVE METABOLIC PANEL
ALT: 21 U/L (ref 0–53)
AST: 20 U/L (ref 0–37)
Albumin: 4.1 g/dL (ref 3.5–5.2)
Alkaline Phosphatase: 57 U/L (ref 39–117)
BUN: 14 mg/dL (ref 6–23)
CO2: 27 mEq/L (ref 19–32)
Calcium: 9.2 mg/dL (ref 8.4–10.5)
Chloride: 104 mEq/L (ref 96–112)
Creat: 1.08 mg/dL (ref 0.40–1.50)
Glucose, Bld: 79 mg/dL (ref 70–99)
Potassium: 4.3 mEq/L (ref 3.5–5.3)
Sodium: 141 mEq/L (ref 135–145)
Total Bilirubin: 0.4 mg/dL (ref 0.3–1.2)
Total Protein: 6.7 g/dL (ref 6.0–8.3)

## 2011-01-27 LAB — LIPID PANEL
Cholesterol: 185 mg/dL (ref 0–200)
HDL: 61 mg/dL (ref >39)
LDL Cholesterol: 111 mg/dL — ABNORMAL HIGH (ref 0–99)
Total CHOL/HDL Ratio: 3 Ratio
Triglycerides: 63 mg/dL (ref <150)
VLDL: 13 mg/dL (ref 0–40)

## 2011-01-27 LAB — PSA: PSA: 1.34 ng/mL (ref <=4.00)

## 2011-01-27 MED ORDER — AMLODIPINE BESYLATE 10 MG PO TABS: 10.0000 mg | ORAL_TABLET | Freq: Every day | ORAL | Status: DC

## 2011-01-27 MED ORDER — PRAVASTATIN SODIUM 20 MG PO TABS: 20.0000 mg | ORAL_TABLET | Freq: Every day | ORAL | Status: DC

## 2011-01-27 MED ORDER — LISINOPRIL 10 MG PO TABS: 10.0000 mg | ORAL_TABLET | Freq: Every day | ORAL | Status: DC

## 2011-01-27 NOTE — Progress Notes (Signed)
  Subjective:    Patient ID: Lilburn Straw, male    DOB: 1935/05/09, 76 y.o.   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  Last labs done (here, anyway) one year ago.  Review of Systems As above    Objective:   Physical Exam Heent normal  Neck no LAD Lungs CTA Car: RRR Abd-large left inguinal hernia-reduced with some difficulty Ext-normal      Assessment & Plan:   History HTN-check CMET continue medications Inguinal hernia-he will "think about" elective repair Advised on Si/Sx of incarceration/strangulation and risks of that Recheck lipid profile today

## 2011-01-28 LAB — CBC
HCT: 36.9 % — ABNORMAL LOW (ref 39.0–52.0)
Hemoglobin: 11.6 g/dL — ABNORMAL LOW (ref 13.0–17.0)
MCH: 30.2 pg (ref 26.0–34.0)
MCHC: 31.4 g/dL (ref 30.0–36.0)
MCV: 96.1 fL (ref 78.0–100.0)
Platelets: 240 10*3/uL (ref 150–400)
RBC: 3.84 MIL/uL — ABNORMAL LOW (ref 4.22–5.81)
RDW: 12.7 % (ref 11.5–15.5)
WBC: 4.4 10*3/uL (ref 4.0–10.5)

## 2011-03-24 ENCOUNTER — Ambulatory Visit (INDEPENDENT_AMBULATORY_CARE_PROVIDER_SITE_OTHER): Payer: PRIVATE HEALTH INSURANCE | Admitting: Internal Medicine

## 2011-03-24 ENCOUNTER — Encounter: Payer: Self-pay | Admitting: Internal Medicine

## 2011-03-24 VITALS — BP 154/78 | HR 69 | Temp 97.4°F | Resp 20 | Ht 68.0 in | Wt 181.7 lb

## 2011-03-24 DIAGNOSIS — R319 Hematuria, unspecified: Secondary | ICD-10-CM | POA: Insufficient documentation

## 2011-03-24 DIAGNOSIS — R300 Dysuria: Secondary | ICD-10-CM | POA: Insufficient documentation

## 2011-03-24 DIAGNOSIS — K4090 Unilateral inguinal hernia, without obstruction or gangrene, not specified as recurrent: Secondary | ICD-10-CM

## 2011-03-24 LAB — URINALYSIS
Bilirubin Urine: NEGATIVE
Glucose, UA: NEGATIVE mg/dL
Ketones, ur: NEGATIVE mg/dL
Nitrite: NEGATIVE
Protein, ur: 100 mg/dL — AB
Specific Gravity, Urine: 1.02 (ref 1.005–1.030)
Urobilinogen, UA: 0.2 mg/dL (ref 0.0–1.0)
pH: 5.5 (ref 5.0–8.0)

## 2011-03-24 LAB — POCT URINALYSIS DIPSTICK
Bilirubin, UA: NEGATIVE
Glucose, UA: NEGATIVE
Ketones, UA: NEGATIVE
Nitrite, UA: NEGATIVE
Protein, UA: 100
Spec Grav, UA: >=1.03
Urobilinogen, UA: 0.2
pH, UA: 5

## 2011-03-24 MED ORDER — CIPROFLOXACIN HCL 500 MG PO TABS: 500.0000 mg | ORAL_TABLET | Freq: Two times a day (BID) | ORAL | Status: DC

## 2011-03-24 NOTE — Patient Instructions (Signed)
Please make a followup appointment with Dr. Allena Katz on next Wednesday or Thursday. Please take the antibiotic ciprofloxacin 500 mg tablets 2 times daily for next 10 days. Please continue taking all the medications regularly.

## 2011-03-24 NOTE — Assessment & Plan Note (Signed)
He is ready to get evaluated for surgery and so we'll refer him to surgery next visit for elective surgical repair evaluation of his left inguinal hernia

## 2011-03-24 NOTE — Progress Notes (Signed)
  Subjective:    Patient ID: [name], male    DOB: [birth year], 76 y.o.   MRN: [id number]  HPI [name] is a pleasant 76 year old man with a past with history of prostate cancer status post radiation therapy completed 6-8 months before, hypertension who comes to the clinic with chief complaint of blood in urine for past 2 weeks. Says that the blood started suddenly and was in the initial stream and then faded slowly at the end of urination. During the first week he had red urine but then for the second week it turned pinkish. He still notices faint blood in the urine. Here for complaints of burning and soreness in his hernia(left inguinal), during urination for these past 2 weeks. Denies any fever, chills, nausea, vomiting, abdominal pain, chest pain, short of breath, back pain. Also complains of blood in the stool but says that he's been told by the oncologist that he will have it for about a year after completion of radiation.   Review of Systems    as per history of present illness Objective:   Physical Exam    Constitutional: Vital signs reviewed.  Patient is a well-developed and well-nourished in no acute distress and cooperative with exam. Alert and oriented x3.  Head: Normocephalic and atraumatic Mouth: no erythema or exudates, MMM Eyes: PERRL, EOMI, conjunctivae normal, No scleral icterus.  Neck: Supple, Trachea midline normal ROM, No JVD Cardiovascular: RRR, S1 normal, S2 normal, no MRG Pulmonary/Chest: CTAB, no wheezes, rales, or rhonchi Abdominal: Soft. Non-tender, non-distended, bowel sounds are normal, no masses, organomegaly, or guarding present. Large reducible left inguinal hernia, no tenderness. GU: no CVA tenderness Musculoskeletal: No joint deformities, erythema, or stiffness, ROM full and no nontender Neurological: A&O x3, Strenght is normal and symmetric bilaterally, cranial nerve II-XII are grossly intact, no focal motor deficit, sensory intact to light touch  bilaterally.  Skin: Warm, dry and intact. No rash, cyanosis, or clubbing.       Assessment & Plan:

## 2011-03-24 NOTE — Assessment & Plan Note (Signed)
hematuria since last [redacted] weeks along with dysuria and soreness in hernia site. Dipstick urine shows large blood and trace leukocytes. Considering his recent radiation therapy for prostate cancer, he has high likelihood of hematuria due to radiation injury, but can also have prostatitis versus UTI. Will collect urine for UA and culture and will empirically treat with ciprofloxacin 500 mg by mouth twice a day for 10 days. I Will see him back on next Wednesday or Thursday and if he still having symptoms will refer him to urology for further evaluation.

## 2011-03-26 LAB — URINE CULTURE
Colony Count: NO GROWTH
Organism ID, Bacteria: NO GROWTH

## 2011-03-30 ENCOUNTER — Encounter: Payer: Self-pay | Admitting: Internal Medicine

## 2011-04-01 ENCOUNTER — Ambulatory Visit (INDEPENDENT_AMBULATORY_CARE_PROVIDER_SITE_OTHER): Payer: PRIVATE HEALTH INSURANCE | Admitting: Internal Medicine

## 2011-04-01 ENCOUNTER — Encounter: Payer: Self-pay | Admitting: Internal Medicine

## 2011-04-01 VITALS — BP 140/72 | HR 58 | Temp 98.5°F | Ht 71.5 in | Wt 184.0 lb

## 2011-04-01 DIAGNOSIS — K4090 Unilateral inguinal hernia, without obstruction or gangrene, not specified as recurrent: Secondary | ICD-10-CM

## 2011-04-01 DIAGNOSIS — R319 Hematuria, unspecified: Secondary | ICD-10-CM

## 2011-04-01 LAB — CBC WITH DIFFERENTIAL/PLATELET
Basophils Absolute: 0 10*3/uL (ref 0.0–0.1)
Basophils Relative: 0 % (ref 0–1)
Eosinophils Absolute: 0.2 10*3/uL (ref 0.0–0.7)
Eosinophils Relative: 4 % (ref 0–5)
HCT: 34.2 % — ABNORMAL LOW (ref 39.0–52.0)
Hemoglobin: 11.4 g/dL — ABNORMAL LOW (ref 13.0–17.0)
Lymphocytes Relative: 26 % (ref 12–46)
Lymphs Abs: 1.6 10*3/uL (ref 0.7–4.0)
MCH: 30.6 pg (ref 26.0–34.0)
MCHC: 33.3 g/dL (ref 30.0–36.0)
MCV: 91.7 fL (ref 78.0–100.0)
Monocytes Absolute: 0.8 10*3/uL (ref 0.1–1.0)
Monocytes Relative: 14 % — ABNORMAL HIGH (ref 3–12)
Neutro Abs: 3.4 10*3/uL (ref 1.7–7.7)
Neutrophils Relative %: 57 % (ref 43–77)
Platelets: 244 10*3/uL (ref 150–400)
RBC: 3.73 MIL/uL — ABNORMAL LOW (ref 4.22–5.81)
RDW: 12.6 % (ref 11.5–15.5)
WBC: 6 10*3/uL (ref 4.0–10.5)

## 2011-04-01 MED ORDER — TRAMADOL HCL 50 MG PO TABS: 50.0000 mg | ORAL_TABLET | Freq: Four times a day (QID) | ORAL | Status: DC | PRN

## 2011-04-01 NOTE — Progress Notes (Signed)
  Subjective:    Patient ID: [name], male    DOB: October 04, 1935, 76 y.o.   MRN: [id number]  HPI [name] is a pleasant 76 year old man with past with a history of prostate cancer status post radiation therapy, inguinal hernia who comes to the clinic for followup visit for his hematuria for 3 weeks now. He has blood in the urine every time. Also whenever he goes for urination he has pain in his hernia site. I checked her his urine culture last visit, which was negative. He has a urologist Dr. Shiela Mayer who he is going to see for his regular visit in July. Denies any fever, chills, night sweats, abdominal pain, chest pain, shortness of breath, headache, vision changes. Also he wants to get evaluated for his hernia for possible repair.   Review of Systems    as per history of present illness Objective:   Physical Exam    Constitutional: Vital signs reviewed.  Patient is a well-developed and well-nourished in no acute distress and cooperative with exam. Alert and oriented x3.  Head: Normocephalic and atraumatic Mouth: no erythema or exudates, MMM Eyes: PERRL, EOMI, conjunctivae normal, No scleral icterus.  Neck: Supple, Trachea midline normal ROM, No JVD, mass, thyromegaly, or carotid bruit present.  Cardiovascular: RRR, S1 normal, S2 normal, no MRG, pulses symmetric and intact bilaterally Pulmonary/Chest: CTAB, no wheezes, rales, or rhonchi Abdominal: Soft. Non-tender, non-distended, bowel sounds are normal, no masses, organomegaly, or guarding present. Left inguinal reducible hernia, no tenderness. GU: no CVA tenderness Musculoskeletal: No joint deformities, erythema, or stiffness, ROM full and no nontender  Neurological: A&O x3, Strenght is normal and symmetric bilaterally, cranial nerve II-XII are grossly intact, no focal motor deficit, sensory intact to light touch bilaterally.  Skin: Warm, dry and intact. No rash, cyanosis, or clubbing.       Assessment & Plan:

## 2011-04-01 NOTE — Patient Instructions (Signed)
Please make a follow up appointment in 2-3 months. Please continue taking all the medications regularly. Make an early appointment with your Urologist for further evaluation of blood in urine. Also we will arrange for appointment with Surgeon for Hernia.

## 2011-04-01 NOTE — Assessment & Plan Note (Addendum)
Still having blood in urine for total 3 weeks now. Urine culture negative for infection. We'll stop the ciprofloxacin today. He has a urologist Dr. Shiela Mayer who he said will make an early appointment with him. Advised him to make an appointment as soon as possible with him and get further evaluation and management. He is understanding. He also has severe pain in the hernia site when he goes for urination and has tried Advil which didn't help. We'll give him tramadol 30 tablets, one tablet every 6 hour when necessary for breakthrough pain until he sees his urologist.

## 2011-04-01 NOTE — Assessment & Plan Note (Signed)
Considering his readiness for getting evaluated for surgery for his hernia, will represent to Gen. surgery today.

## 2011-04-08 ENCOUNTER — Other Ambulatory Visit (INDEPENDENT_AMBULATORY_CARE_PROVIDER_SITE_OTHER): Payer: Self-pay | Admitting: Surgery

## 2011-04-08 DIAGNOSIS — IMO0001 Reserved for inherently not codable concepts without codable children: Secondary | ICD-10-CM

## 2011-04-09 ENCOUNTER — Other Ambulatory Visit: Payer: PRIVATE HEALTH INSURANCE

## 2011-04-10 ENCOUNTER — Ambulatory Visit
Admission: RE | Admit: 2011-04-10 | Discharge: 2011-04-10 | Disposition: A | Discharge status: Home / Self Care | Payer: PRIVATE HEALTH INSURANCE | Source: Ambulatory Visit | Attending: Surgery | Admitting: Surgery

## 2011-04-10 ENCOUNTER — Other Ambulatory Visit (INDEPENDENT_AMBULATORY_CARE_PROVIDER_SITE_OTHER): Payer: Self-pay | Admitting: Surgery

## 2011-04-10 DIAGNOSIS — IMO0001 Reserved for inherently not codable concepts without codable children: Secondary | ICD-10-CM

## 2011-04-10 DIAGNOSIS — R1032 Left lower quadrant pain: Secondary | ICD-10-CM

## 2011-04-22 ENCOUNTER — Ambulatory Visit
Admission: RE | Admit: 2011-04-22 | Discharge: 2011-04-22 | Disposition: A | Discharge status: Home / Self Care | Payer: PRIVATE HEALTH INSURANCE | Source: Ambulatory Visit | Attending: Urology | Admitting: Urology

## 2011-04-22 ENCOUNTER — Encounter (INDEPENDENT_AMBULATORY_CARE_PROVIDER_SITE_OTHER): Payer: Self-pay | Admitting: Surgery

## 2011-04-22 ENCOUNTER — Other Ambulatory Visit: Payer: Self-pay | Admitting: Urology

## 2011-04-22 DIAGNOSIS — Z01811 Encounter for preprocedural respiratory examination: Secondary | ICD-10-CM

## 2011-04-24 ENCOUNTER — Ambulatory Visit (HOSPITAL_BASED_OUTPATIENT_CLINIC_OR_DEPARTMENT_OTHER)
Admission: RE | Admit: 2011-04-24 | Discharge: 2011-04-24 | Disposition: A | Discharge status: Home / Self Care | Payer: PRIVATE HEALTH INSURANCE | Source: Ambulatory Visit | Attending: Urology | Admitting: Urology

## 2011-04-24 DIAGNOSIS — R310 Gross hematuria: Secondary | ICD-10-CM | POA: Insufficient documentation

## 2011-04-24 DIAGNOSIS — Z01812 Encounter for preprocedural laboratory examination: Secondary | ICD-10-CM | POA: Insufficient documentation

## 2011-04-24 DIAGNOSIS — N35919 Unspecified urethral stricture, male, unspecified site: Secondary | ICD-10-CM | POA: Insufficient documentation

## 2011-04-24 DIAGNOSIS — Z923 Personal history of irradiation: Secondary | ICD-10-CM | POA: Insufficient documentation

## 2011-04-24 DIAGNOSIS — I10 Essential (primary) hypertension: Secondary | ICD-10-CM | POA: Insufficient documentation

## 2011-04-24 DIAGNOSIS — Z01810 Encounter for preprocedural cardiovascular examination: Secondary | ICD-10-CM | POA: Insufficient documentation

## 2011-04-24 DIAGNOSIS — Z8546 Personal history of malignant neoplasm of prostate: Secondary | ICD-10-CM | POA: Insufficient documentation

## 2011-04-26 ENCOUNTER — Emergency Department (HOSPITAL_COMMUNITY)
Admission: EM | Admit: 2011-04-26 | Discharge: 2011-04-26 | Disposition: A | Discharge status: Home / Self Care | Payer: PRIVATE HEALTH INSURANCE | Attending: Emergency Medicine | Admitting: Emergency Medicine

## 2011-04-26 DIAGNOSIS — Y846 Urinary catheterization as the cause of abnormal reaction of the patient, or of later complication, without mention of misadventure at the time of the procedure: Secondary | ICD-10-CM | POA: Insufficient documentation

## 2011-04-26 DIAGNOSIS — T8389XA Other specified complication of genitourinary prosthetic devices, implants and grafts, initial encounter: Secondary | ICD-10-CM | POA: Insufficient documentation

## 2011-04-26 DIAGNOSIS — E7800 Pure hypercholesterolemia, unspecified: Secondary | ICD-10-CM | POA: Insufficient documentation

## 2011-04-26 DIAGNOSIS — C61 Malignant neoplasm of prostate: Secondary | ICD-10-CM | POA: Insufficient documentation

## 2011-04-26 DIAGNOSIS — I10 Essential (primary) hypertension: Secondary | ICD-10-CM | POA: Insufficient documentation

## 2011-04-29 NOTE — Op Note (Signed)
  NAMEALVESTER, EADS NO.:  [id number]  MEDICAL RECORD NO.:  [id number]  LOCATION:                                 FACILITY:  PHYSICIAN:  Maretta Bees. Vonita Moss, M.D.DATE OF BIRTH:  04-Jun-1935  DATE OF PROCEDURE:  04/24/2011 DATE OF DISCHARGE:                              OPERATIVE REPORT   PREOPERATIVE DIAGNOSIS:  Hematuria and deep bulbous urethral stricture.  POSTPROCEDURE DIAGNOSIS:  Hematuria and deep bulbous urethral stricture.  PROCEDURE:  Cystoscopy and dilation of urethral stricture with balloon and Hayman dilators and cystogram and insertion of Foley catheter.  SURGEON:  Maretta Bees. Vonita Moss, M.D.  ANESTHESIA:  General.  INDICATIONS:  This gentleman has a past history of radiation therapy for Gleason 7 carcinoma of the prostate.  He also has a long history of frequent urination and has been on anticholinergics.  He was also having dysuria and some recent gross hematuria and cystoscopy in the office revealed a bulbous stricture and he is brought to the OR today for further treatment and evaluation.  PROCEDURE IN DETAIL:  The patient was brought to the operating room and placed in lithotomy position.  The external genitalia were prepped and draped in usual fashion.  He was cystoscoped and there was a pinpoint deep bulbous urethral stricture through which a metal guidewire was passed.  I then used the 4 cm balloon dilator on two occasions, but it seemed to slip above and below the strictured area, so I ended up using the cap Hayman dilators to dilate him from 12-28 [place].  His stricture felt quite dense.  I was then able to cystoscope him and he had about a 1-2 cm bulbous urethral stricture.  This was just beyond the external sphincter.  The prostate appeared small and nonobstructing.  The bladder with small capacity, both with direct visualization and attempted filling of the bladder and also with a cystogram, it showed a small trabeculated rounded  bladder.  There was some inflammation on the bladder wall from the guidewires, but I saw no stones or papillary tumors.  I then inserted a 22-French Foley catheter without difficulty and it irrigated nicely and was connected to close drainage.  Patient tolerated the procedure well.     Maretta Bees. Vonita Moss, M.D.     LJP/MEDQ  D:  04/24/2011  T:  04/24/2011  Job:  [id number]  Electronically Signed by Larey Dresser M.D. on 04/29/2011 03:50:55 PM

## 2011-05-14 ENCOUNTER — Encounter (HOSPITAL_COMMUNITY)
Admission: RE | Admit: 2011-05-14 | Discharge: 2011-05-14 | Disposition: A | Discharge status: Home / Self Care | Payer: PRIVATE HEALTH INSURANCE | Source: Ambulatory Visit | Attending: Surgery | Admitting: Surgery

## 2011-05-14 LAB — URINALYSIS, ROUTINE W REFLEX MICROSCOPIC
Bilirubin Urine: NEGATIVE
Glucose, UA: NEGATIVE mg/dL
Ketones, ur: NEGATIVE mg/dL
Specific Gravity, Urine: 1.017 (ref 1.005–1.030)
pH: 6.5 (ref 5.0–8.0)

## 2011-05-14 LAB — SURGICAL PCR SCREEN
MRSA, PCR: POSITIVE — AB
Staphylococcus aureus: POSITIVE — AB

## 2011-05-14 LAB — CBC
HCT: 33.7 % — ABNORMAL LOW (ref 39.0–52.0)
MCH: 30.3 pg (ref 26.0–34.0)
MCV: 90.3 fL (ref 78.0–100.0)
RBC: 3.73 MIL/uL — ABNORMAL LOW (ref 4.22–5.81)
WBC: 6.2 10*3/uL (ref 4.0–10.5)

## 2011-05-14 LAB — BASIC METABOLIC PANEL
Calcium: 9.2 mg/dL (ref 8.4–10.5)
GFR calc Af Amer: >60 mL/min (ref >60)
GFR calc non Af Amer: >60 mL/min (ref >60)
Glucose, Bld: 92 mg/dL (ref 70–99)
Sodium: 141 mEq/L (ref 135–145)

## 2011-05-14 LAB — URINE MICROSCOPIC-ADD ON

## 2011-05-18 ENCOUNTER — Ambulatory Visit (HOSPITAL_COMMUNITY)
Admission: RE | Admit: 2011-05-18 | Discharge: 2011-05-18 | Disposition: A | Discharge status: Home / Self Care | Payer: PRIVATE HEALTH INSURANCE | Source: Ambulatory Visit | Attending: Surgery | Admitting: Surgery

## 2011-05-18 DIAGNOSIS — Z01812 Encounter for preprocedural laboratory examination: Secondary | ICD-10-CM | POA: Insufficient documentation

## 2011-05-18 DIAGNOSIS — I10 Essential (primary) hypertension: Secondary | ICD-10-CM | POA: Insufficient documentation

## 2011-05-18 DIAGNOSIS — K4090 Unilateral inguinal hernia, without obstruction or gangrene, not specified as recurrent: Secondary | ICD-10-CM

## 2011-05-18 DIAGNOSIS — Z87891 Personal history of nicotine dependence: Secondary | ICD-10-CM | POA: Insufficient documentation

## 2011-05-19 NOTE — Op Note (Signed)
NAMEROLEN, CONGER NO.:  [id number]  MEDICAL RECORD NO.:  [id number]  LOCATION:  [facility]                         FACILITY:  [facility]  PHYSICIAN:  Wilmon Arms. Corliss Skains, M.D. DATE OF BIRTH:  September 24, 1935  DATE OF PROCEDURE:  05/18/2011 DATE OF DISCHARGE:  05/18/2011                              OPERATIVE REPORT   PREOPERATIVE DIAGNOSIS:  Left inguinal hernia.  POSTOPERATIVE DIAGNOSIS:  Left inguinal hernia.  PROCEDURE:  Left inguinal hernia repair with mesh.  SURGEON:  Wilmon Arms. Corliss Skains, MD  ANESTHESIA:  General  INDICATIONS:  This is a 76 year old male who presents with a long history of a left inguinal hernia.  This has gotten very large, extends down in the scrotum.  This was confirmed on a CT scan showing the descending colon extending into the hemiscrotum.  There is no sign of obstruction.  He presents now for repair.  DESCRIPTION OF PROCEDURE:  The patient was brought to the operating room, placed in the supine position on the operating room table.  After an adequate level of general anesthesia was obtained, a Foley catheter was placed under sterile technique.  We placed a Foley catheter because the patient was dribbling urine.  He has a problem with this at baseline.  We shaved his left groin, prepped with Betadine and draped in sterile fashion.  Time-out was taken to ensure the proper patient, proper procedure.  We made an oblique incision over the hernia.  We are able to manually reduce the hernia prior to making the incision. Dissection was carried down to the subcutaneous tissues with cautery. We dissected down the external oblique fascia.  A very large hernia sac was seen protruding from the external ring.  We opened the external oblique fascia along with the direction of its fibers down to the external ring.  We placed another retractor for exposure.  We bluntly dissected around the spermatic cord retractors with a Penrose drain. The patient has a  very large direct hernia defect.  We reduced the size of scrotum and we were able to reduce this back into the preperitoneal space.  This was held in place with a sponge stick.  We reapproximated the floor of the inguinal canal with a 0 Vicryl.  The sponge stick was removed.  We then skeletonized the spermatic cord.  A moderate size cord lipoma with indirect hernia sac were reduced back to the internal ring. The internal ring was tightened with a 2-0 Vicryl.  We cut UltraPro mesh in a keyhole shape and secured this with 2-0 Prolene beginning at the pubic tubercle.  We ran this along the shelving edge inferiorly and external oblique fascia superiorly.  The tails of the mesh was sutured together behind the spermatic cord.  The Penrose drain was removed.  The fascia was reapproximated with 2-0 Vicryl.  A 3-0 Vicryl was used to close subcutaneous tissues and 4-0 Monocryl was used to close the skin.  Steri- Strips and clean dressings were applied.  A Foley catheter was removed. The patient was extubated, brought to recovery in stable condition.  All sponge, instrument, needle counts were correct.     Wilmon Arms. Sinai Mahany, M.D.  MKT/MEDQ  D:  05/18/2011  T:  05/18/2011  Job:  [id number]  Electronically Signed by Manus Rudd M.D. on 05/19/2011 05:04:25 PM

## 2011-05-29 ENCOUNTER — Telehealth (INDEPENDENT_AMBULATORY_CARE_PROVIDER_SITE_OTHER): Payer: Self-pay | Admitting: General Surgery

## 2011-05-29 NOTE — Telephone Encounter (Signed)
PT HAD HERNIA SURGERY ON 05-18-11. CALLED FOR PAIN MED REFILL/ STANDARD POST-OP PAIN MED/ VICODIN 5/325 #30 CALLED TO [facility]/ PT HAS F/U WITH DR. Corliss Skains ON 08 -2-12.

## 2011-06-02 ENCOUNTER — Encounter (INDEPENDENT_AMBULATORY_CARE_PROVIDER_SITE_OTHER): Payer: Self-pay | Admitting: Surgery

## 2011-06-04 ENCOUNTER — Encounter (INDEPENDENT_AMBULATORY_CARE_PROVIDER_SITE_OTHER): Payer: Self-pay | Admitting: Surgery

## 2011-06-04 ENCOUNTER — Ambulatory Visit (INDEPENDENT_AMBULATORY_CARE_PROVIDER_SITE_OTHER): Payer: PRIVATE HEALTH INSURANCE | Admitting: Surgery

## 2011-06-04 DIAGNOSIS — Z9889 Other specified postprocedural states: Secondary | ICD-10-CM

## 2011-06-04 DIAGNOSIS — Z8719 Personal history of other diseases of the digestive system: Secondary | ICD-10-CM

## 2011-06-04 NOTE — Progress Notes (Signed)
2 weeks status post open repair of a very large direct inguinal hernia. The patient seems to be doing well. He reports only occasional soreness. His incision is well healed with no sign of infection or drainage. He has a fair amount of firm scar tissue underneath the incision. No sign of recurrent hernia. He should continue limiting his level of activity for the next 2 or 3 weeks. Followup on a p.r.n. basis.

## 2011-08-24 ENCOUNTER — Ambulatory Visit (HOSPITAL_BASED_OUTPATIENT_CLINIC_OR_DEPARTMENT_OTHER)
Admission: RE | Admit: 2011-08-24 | Discharge: 2011-08-24 | Disposition: A | Discharge status: Home / Self Care | Payer: PRIVATE HEALTH INSURANCE | Source: Ambulatory Visit | Attending: Urology | Admitting: Urology

## 2011-08-24 DIAGNOSIS — E7800 Pure hypercholesterolemia, unspecified: Secondary | ICD-10-CM | POA: Insufficient documentation

## 2011-08-24 DIAGNOSIS — Z923 Personal history of irradiation: Secondary | ICD-10-CM | POA: Insufficient documentation

## 2011-08-24 DIAGNOSIS — N35919 Unspecified urethral stricture, male, unspecified site: Secondary | ICD-10-CM | POA: Insufficient documentation

## 2011-08-24 DIAGNOSIS — R310 Gross hematuria: Secondary | ICD-10-CM | POA: Insufficient documentation

## 2011-08-24 DIAGNOSIS — I10 Essential (primary) hypertension: Secondary | ICD-10-CM | POA: Insufficient documentation

## 2011-08-24 DIAGNOSIS — N320 Bladder-neck obstruction: Secondary | ICD-10-CM | POA: Insufficient documentation

## 2011-08-24 DIAGNOSIS — C61 Malignant neoplasm of prostate: Secondary | ICD-10-CM | POA: Insufficient documentation

## 2011-08-24 DIAGNOSIS — N3289 Other specified disorders of bladder: Secondary | ICD-10-CM | POA: Insufficient documentation

## 2011-08-24 DIAGNOSIS — Z01812 Encounter for preprocedural laboratory examination: Secondary | ICD-10-CM | POA: Insufficient documentation

## 2011-08-24 LAB — POCT I-STAT 4, (NA,K, GLUC, HGB,HCT): HCT: 35 % — ABNORMAL LOW (ref 39.0–52.0)

## 2011-08-25 ENCOUNTER — Other Ambulatory Visit: Payer: Self-pay | Admitting: Urology

## 2011-08-29 NOTE — Op Note (Signed)
[name], GUIN NO.:  [id number]  MEDICAL RECORD NO.:  [id number]  LOCATION:                                 FACILITY:  PHYSICIAN:  Natalia Leatherwood, MD    DATE OF BIRTH:  05-Oct-1935  DATE OF PROCEDURE:  08/24/2011 DATE OF DISCHARGE:                              OPERATIVE REPORT   SURGEON:  Natalia Leatherwood, MD  ASSISTANT:  None.  PREOPERATIVE DIAGNOSES: 1. Gross hematuria. 2. Urethral stricture.  POSTOPERATIVE DIAGNOSES: 1. Urethral stricture. 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 Cystoscopy. 2. Balloon dilation of urethral stricture. 3. Bilateral retrograde pyelogram. 4. Transurethral resection of bladder neck contracture. 5. Bladder biopsy.  FINDINGS:  Bladder neck contracture, bladder erythema, lead-pipe urethra, no filling defects in bilateral upper tract collecting systems, left distal ureteral stenosis.  SPECIMEN:  Bladder floor biopsy was sent as one specimen to pathology and chips from the transurethral resection of bladder neck contracture were sent for another permanent pathology.  ESTIMATED BLOOD LOSS:  Minimal.  COMPLICATIONS:  None.  HISTORY OF PRESENT ILLNESS:  This is a pleasant 76 year old gentleman, who has history of radiation treatment for his prostate cancer.  He has lower urinary tract symptoms and had gross hematuria.  During workup of his hematuria, a flexible cystoscope was unable to be passed through the urethra into the bladder due to an urethral stricture and was felt the patient needed further workup for his gross hematuria.  He consented after informed consent was obtained regarding the risks, benefits, alternatives, and likelihood of achieving his goals, he did wish to proceed.  PROCEDURE:  Informed consent was obtained.  The patient was taken to the operating room, was placed in supine position, IV antibiotics were infused and general anesthesia was induced.  Time-out was  performed with correct patient, surgical site, and procedure were identified and agreed upon.  Following this, his genitals were prepped and draped in usual fashion after he was placed in a dorsal lithotomy position.  A 12-degree rigid cystoscope was attempted to be passed through the urethra, however, this was unsuccessful.  Therefore, a Sensor tip wire was placed through the urethra into the bladder with the curl in the bladder on fluoroscopy.  Following this, a 24-French balloon dilator was placed and inflated to 14 atmospheres and held for 3 minutes.  After this was done, the balloon dilator was removed and the rigid cystoscope was able to be passed through the urethra.  It was noted that I felt that he had lead- pipe urethra through the prostatic urethra.  He also was noted to have a bladder neck contracture.  Upon entering the bladder, it was noted that he had a very small capacity bladder with some erythema on the bladder walls consistent with radiation cystitis.  The bladder was evaluated with a 30-degree and 70-degree lens and there were no lesions in the bladder.  There was some necrotic-appearing tissue at the bladder neck associated with bladder neck contracture.  After this was done, retrograde pyelogram was obtained on the right side by cannulating the ureteral orifice with a 5-French open-ended Pollock catheter and obtaining a retrograde pyelogram.  There were no filling defects in the collecting system or ureter and emptied out well.  Attempt of the left ureter retrograde pyelogram was more difficult as the left ureter was very stenotic.  It was difficult to cannulate and angle tip guidewire was required to cannulate this and then placed a 5-French open-ended Pollock catheter over this.  Retrograde pyelogram was obtained.  There were no filling defects in the collecting system or ureter.  After this, it was noted that the system was slightly hydronephrotic, however,  it emptied out well on drainage films.  The visual obturator to the gyrus resectoscope was placed and transurethral resection with bladder neck contracture was carried out. These chips were removed from the bladder and sent for permanent pathology.  Gyrus resectoscope was used to biopsy an area on the bladder floor of changes that looked consistent with radiation cystitis.  This was sent for separate pathology.  The hemostasis was maintained with electrocautery and after this was done, a 24-French 3-way Foley catheter was placed with 30 cc of sterile water in the balloon.  This completed the procedure.  Patient was placed back to supine position, anesthesia was reversed and taken to PACU in stable condition.  He will keep the catheter in for 2 weeks and follow up with me for catheter removal.          ______________________________ Natalia Leatherwood, MD     DW/MEDQ  D:  08/24/2011  T:  08/25/2011  Job:  [id number]  Electronically Signed by Natalia Leatherwood MD on 08/29/2011 02:15:27 PM

## 2011-10-13 ENCOUNTER — Encounter: Payer: Self-pay | Admitting: Internal Medicine

## 2011-10-13 ENCOUNTER — Ambulatory Visit (INDEPENDENT_AMBULATORY_CARE_PROVIDER_SITE_OTHER): Payer: PRIVATE HEALTH INSURANCE | Admitting: Internal Medicine

## 2011-10-13 VITALS — BP 104/60 | HR 58 | Temp 97.4°F | Wt 162.3 lb

## 2011-10-13 DIAGNOSIS — E785 Hyperlipidemia, unspecified: Secondary | ICD-10-CM

## 2011-10-13 DIAGNOSIS — I10 Essential (primary) hypertension: Secondary | ICD-10-CM

## 2011-10-13 DIAGNOSIS — H9192 Unspecified hearing loss, left ear: Secondary | ICD-10-CM

## 2011-10-13 DIAGNOSIS — H9190 Unspecified hearing loss, unspecified ear: Secondary | ICD-10-CM | POA: Insufficient documentation

## 2011-10-13 DIAGNOSIS — H9113 Presbycusis, bilateral: Secondary | ICD-10-CM | POA: Insufficient documentation

## 2011-10-13 DIAGNOSIS — R319 Hematuria, unspecified: Secondary | ICD-10-CM

## 2011-10-13 NOTE — Assessment & Plan Note (Signed)
Continue Pravachol. Recheck lipid profile in March 2013

## 2011-10-13 NOTE — Assessment & Plan Note (Signed)
Resolved.  Followed by urology closely. Next appointment in early part of January.

## 2011-10-13 NOTE — Progress Notes (Signed)
  Subjective:    Patient ID: [name], male    DOB: [birth year], 76 y.o.   MRN: [id number]  HPI [name] is a pleasant 8 year man with past history of hypertension, hyper lipidemia, left inguinal hernia status post surgery in August 2012, hematuria after radiation- followed by urology- comes the clinic for regular followup visit and complains of left ear hearing loss.  He is accompanied by his wife and they complain of hearing loss in left ear which is getting worse for past 4 weeks or so. He turns up the volume of TV very high.  Also during the conversations today he had a hard time listening and responding- compared to when I saw him last visit.  He denies any earache, discharge, headache, vision changes. Also denies any fever, nausea, vomiting, dizziness.  He has had inguinal hernia surgery in August 2012 and after that is followed by urologist for hematuria - which is much better now .  He denies any chest pain, short of breath, abdominal pain, diarrhea .  He does complain of some insomnia for past few weeks - and says that he's not able to sleep until 4 AM - and what he does his watches TV all the time at night . I explained him to stop watching TV at night and described about sleep hygiene and try to use it before I prescribe any sleeping pills.   He also complains of constipation- but he says that he has bowel movements every other day- which I explained to him is absolutely normal. He has lost about 30 pounds past few months- after his recent surgeries-but says that he's not eating well though. He is feeling better now and said that he would start eating back to normal.    Review of Systems     as per history of present illness, all other systems reviewed and negative. Objective:   Physical Exam  Vitals: T:   HR:   BP:   RR:   O2 saturation:  General: resting in bed HEENT: PERRL, EOMI, no scleral icterus. No discharge or wax in bilateral ears- tympanic membranes normal  bilaterally. Cardiac: RRR, no rubs, murmurs or gallops Pulm: clear to auscultation bilaterally, moving normal volumes of air Abd: soft, nontender, nondistended, BS present Ext: warm and well perfused, no pedal edema Neuro: alert and oriented X3, cranial nerves II-XII grossly intact, strength and sensation to light touch equal in bilateral upper and lower extremities       Assessment & Plan:

## 2011-10-13 NOTE — Patient Instructions (Signed)
Please make a followup appointment in 4-6 months. We will try to make an appointment with ENT Dr.- for your hearing problem. Their office will give you a call with appointment day and time. Followup with urologist and surgeon as you're supposed to. Meanwhile take all the medications regularly- and try to eat regular diet. You do not seem to have a constipation now- its normal to have one bowel movement every 2-3 days. If your sleep gets worse or you don't have  bowel movement for more than 3 days- continuously- give [place] a call- so I can do further management.

## 2011-10-13 NOTE — Assessment & Plan Note (Signed)
Lab Results  Component Value Date   NA 142 08/24/2011   K 3.9 08/24/2011   CL 105 05/14/2011   CO2 28 05/14/2011   BUN 16 05/14/2011   CREATININE 1.02 05/14/2011   CREATININE 1.08 01/27/2011    BP Readings from Last 3 Encounters:  10/13/11 104/60  04/01/11 140/72  03/24/11 154/78    Assessment: Hypertension control:  controlled  Progress toward goals:  at goal Barriers to meeting goals:  no barriers identified  Plan: Hypertension treatment:  continue current medications

## 2011-10-13 NOTE — Assessment & Plan Note (Signed)
Complain of unilateral hearing loss on left side- without any signs of infection or inflammation. While talking with patient- his hearing seems to be worse than before.  I don't think he has any labyrinthitis or infection.  But would do an ENT referral-to assess his hearing and recommend further management.

## 2011-10-14 ENCOUNTER — Encounter: Payer: PRIVATE HEALTH INSURANCE | Admitting: Internal Medicine

## 2011-10-16 ENCOUNTER — Encounter: Payer: PRIVATE HEALTH INSURANCE | Admitting: Internal Medicine

## 2011-10-23 ENCOUNTER — Ambulatory Visit: Payer: PRIVATE HEALTH INSURANCE | Admitting: Dietician

## 2011-11-11 ENCOUNTER — Other Ambulatory Visit: Payer: Self-pay | Admitting: *Deleted

## 2011-11-11 MED ORDER — PRAVASTATIN SODIUM 20 MG PO TABS: 20.0000 mg | ORAL_TABLET | Freq: Every day | ORAL | Status: DC

## 2011-11-27 ENCOUNTER — Other Ambulatory Visit: Payer: Self-pay | Admitting: *Deleted

## 2011-11-27 MED ORDER — LISINOPRIL 10 MG PO TABS: 10.0000 mg | ORAL_TABLET | Freq: Every day | ORAL | Status: DC

## 2012-01-01 ENCOUNTER — Other Ambulatory Visit: Payer: Self-pay | Admitting: *Deleted

## 2012-01-01 MED ORDER — AMLODIPINE BESYLATE 10 MG PO TABS: 10.0000 mg | ORAL_TABLET | Freq: Every day | ORAL | Status: DC

## 2012-01-21 ENCOUNTER — Encounter: Payer: PRIVATE HEALTH INSURANCE | Admitting: Thoracic Surgery (Cardiothoracic Vascular Surgery)

## 2012-02-04 ENCOUNTER — Encounter: Payer: PRIVATE HEALTH INSURANCE | Admitting: Thoracic Surgery (Cardiothoracic Vascular Surgery)

## 2012-02-17 ENCOUNTER — Encounter: Payer: PRIVATE HEALTH INSURANCE | Admitting: Thoracic Surgery (Cardiothoracic Vascular Surgery)

## 2012-02-18 ENCOUNTER — Encounter: Payer: PRIVATE HEALTH INSURANCE | Admitting: Thoracic Surgery (Cardiothoracic Vascular Surgery)

## 2012-05-25 ENCOUNTER — Other Ambulatory Visit: Payer: Self-pay | Admitting: *Deleted

## 2012-05-25 MED ORDER — PRAVASTATIN SODIUM 20 MG PO TABS: 20.0000 mg | ORAL_TABLET | Freq: Every day | ORAL | Status: DC

## 2012-06-17 ENCOUNTER — Other Ambulatory Visit: Payer: Self-pay | Admitting: Urology

## 2012-07-06 ENCOUNTER — Ambulatory Visit (HOSPITAL_BASED_OUTPATIENT_CLINIC_OR_DEPARTMENT_OTHER): Admission: RE | Admit: 2012-07-06 | Payer: PRIVATE HEALTH INSURANCE | Source: Ambulatory Visit | Admitting: Urology

## 2012-07-06 ENCOUNTER — Encounter (HOSPITAL_BASED_OUTPATIENT_CLINIC_OR_DEPARTMENT_OTHER): Admission: RE | Payer: Self-pay | Source: Ambulatory Visit

## 2012-07-06 SURGERY — CYSTOSCOPY, WITH URETHRAL DILATION
Anesthesia: General

## 2012-07-11 ENCOUNTER — Other Ambulatory Visit: Payer: Self-pay | Admitting: *Deleted

## 2012-07-11 MED ORDER — AMLODIPINE BESYLATE 10 MG PO TABS: 10.0000 mg | ORAL_TABLET | Freq: Every day | ORAL | Status: DC

## 2012-10-10 ENCOUNTER — Ambulatory Visit: Payer: PRIVATE HEALTH INSURANCE

## 2012-10-10 ENCOUNTER — Ambulatory Visit (INDEPENDENT_AMBULATORY_CARE_PROVIDER_SITE_OTHER): Payer: PRIVATE HEALTH INSURANCE | Admitting: *Deleted

## 2012-10-10 DIAGNOSIS — Z23 Encounter for immunization: Secondary | ICD-10-CM

## 2012-10-10 DIAGNOSIS — Z299 Encounter for prophylactic measures, unspecified: Secondary | ICD-10-CM

## 2012-11-16 ENCOUNTER — Ambulatory Visit (INDEPENDENT_AMBULATORY_CARE_PROVIDER_SITE_OTHER): Payer: PRIVATE HEALTH INSURANCE | Admitting: Internal Medicine

## 2012-11-16 ENCOUNTER — Encounter: Payer: Self-pay | Admitting: Internal Medicine

## 2012-11-16 VITALS — BP 125/72 | HR 58 | Temp 97.8°F | Ht 71.5 in | Wt 175.0 lb

## 2012-11-16 DIAGNOSIS — R319 Hematuria, unspecified: Secondary | ICD-10-CM

## 2012-11-16 DIAGNOSIS — I10 Essential (primary) hypertension: Secondary | ICD-10-CM

## 2012-11-16 DIAGNOSIS — E785 Hyperlipidemia, unspecified: Secondary | ICD-10-CM

## 2012-11-16 MED ORDER — PRAVASTATIN SODIUM 20 MG PO TABS: 20.0000 mg | ORAL_TABLET | Freq: Every day | ORAL | Status: DC

## 2012-11-16 MED ORDER — AMLODIPINE BESYLATE 10 MG PO TABS: 10.0000 mg | ORAL_TABLET | Freq: Every day | ORAL | Status: DC

## 2012-11-16 MED ORDER — LISINOPRIL 10 MG PO TABS: 10.0000 mg | ORAL_TABLET | Freq: Every day | ORAL | Status: DC

## 2012-11-16 NOTE — Patient Instructions (Signed)
Please make a follow up appointment in 4 months. Take Ibuoprofen 400 mg 3 times a day as needed for your back pain. Don't take it daily for prolonged period as we discussed. All meds refills are in [facility] pharmacy.

## 2012-11-16 NOTE — Assessment & Plan Note (Signed)
Followup with urologist for possible urethral dilation. We'll not do any labs today as he will get it done at urologist office.

## 2012-11-16 NOTE — Assessment & Plan Note (Signed)
Lab Results  Component Value Date   NA 142 08/24/2011   K 3.9 08/24/2011   CL 105 05/14/2011   CO2 28 05/14/2011   BUN 16 05/14/2011   CREATININE 1.02 05/14/2011   CREATININE 1.08 01/27/2011    BP Readings from Last 3 Encounters:  11/16/12 125/72  10/13/11 104/60  04/01/11 140/72    Assessment: Hypertension control:  controlled  Progress toward goals:  at goal Barriers to meeting goals:  no barriers identified  Plan: Hypertension treatment:  continue current medications. Continue lisinopril and amlodipine. Refills sent to pharmacy along with Pravachol.

## 2012-11-16 NOTE — Assessment & Plan Note (Signed)
Continue Pravachol. Will not check lipid panel today. Patient not diabetic

## 2012-11-16 NOTE — Progress Notes (Signed)
  Subjective:    Patient ID: [name], male    DOB: May 11, 1935, 78 y.o.   MRN: [id number]  HPI 78 year old man with past history of prostate cancer status post radiation, hematuria, hypertension, hyperlipidemia and other problems as per problem list who comes in the clinic for regular followup visit for his medical problems. He denies any significant new symptoms. Although he does report having left mid back pain- paraspinal region, he says he feels like he has a pulled muscle. The pain comes and goes. Started about 3 weeks ago. Does not radiate anywhere else.  He denies any fever, chills, nausea, vomiting, abdominal pain, chest pain, short of breath, diarrhea.  His last visit with urologist for his hematuria and urine problems was in August 2013. She was supposed to followup with them for urethral dilation. She will call the clinic and make an appointment for that.  He needs refills for his medications.    Review of Systems    As per history of present illness.  Objective:   Physical Exam  General: NAD HEENT: PERRL, EOMI, no scleral icterus Cardiac: S1, S2, RRR, no rubs, murmurs or gallops Pulm: clear to auscultation bilaterally, moving normal volumes of air Abd: soft, nontender, nondistended, BS present Ext: warm and well perfused, no pedal edema Musculoskeletal: No spinal tenderness. Mild mid back paraspinal tenderness. Neuro: alert and oriented X3, cranial nerves II-XII grossly intact       Assessment & Plan:

## 2013-08-03 ENCOUNTER — Ambulatory Visit (INDEPENDENT_AMBULATORY_CARE_PROVIDER_SITE_OTHER): Payer: PRIVATE HEALTH INSURANCE | Admitting: *Deleted

## 2013-08-03 DIAGNOSIS — Z23 Encounter for immunization: Secondary | ICD-10-CM

## 2013-08-31 ENCOUNTER — Encounter: Payer: PRIVATE HEALTH INSURANCE | Admitting: Internal Medicine

## 2013-11-17 ENCOUNTER — Other Ambulatory Visit: Payer: Self-pay | Admitting: *Deleted

## 2013-11-17 DIAGNOSIS — I10 Essential (primary) hypertension: Secondary | ICD-10-CM

## 2013-11-17 MED ORDER — AMLODIPINE BESYLATE 10 MG PO TABS: 10.0000 mg | ORAL_TABLET | Freq: Every day | ORAL | Status: DC

## 2013-11-17 NOTE — Telephone Encounter (Signed)
Patient needs follow-up appointment in next month or so. Thanks!

## 2013-11-24 ENCOUNTER — Other Ambulatory Visit: Payer: Self-pay | Admitting: *Deleted

## 2013-11-24 DIAGNOSIS — E785 Hyperlipidemia, unspecified: Secondary | ICD-10-CM

## 2013-11-24 MED ORDER — PRAVASTATIN SODIUM 20 MG PO TABS: 20.0000 mg | ORAL_TABLET | Freq: Every day | ORAL | Status: DC

## 2013-12-21 ENCOUNTER — Other Ambulatory Visit: Payer: Self-pay | Admitting: *Deleted

## 2013-12-21 DIAGNOSIS — I10 Essential (primary) hypertension: Secondary | ICD-10-CM

## 2013-12-21 MED ORDER — LISINOPRIL 10 MG PO TABS: 10.0000 mg | ORAL_TABLET | Freq: Every day | ORAL | Status: DC

## 2013-12-29 ENCOUNTER — Ambulatory Visit (INDEPENDENT_AMBULATORY_CARE_PROVIDER_SITE_OTHER): Payer: PRIVATE HEALTH INSURANCE | Admitting: Internal Medicine

## 2013-12-29 ENCOUNTER — Encounter: Payer: Self-pay | Admitting: Internal Medicine

## 2013-12-29 VITALS — BP 136/80 | HR 69 | Temp 97.3°F | Ht 71.5 in | Wt 175.5 lb

## 2013-12-29 DIAGNOSIS — Z8546 Personal history of malignant neoplasm of prostate: Secondary | ICD-10-CM

## 2013-12-29 DIAGNOSIS — Z0000 Encounter for general adult medical examination without abnormal findings: Secondary | ICD-10-CM | POA: Insufficient documentation

## 2013-12-29 DIAGNOSIS — Z7189 Other specified counseling: Secondary | ICD-10-CM | POA: Insufficient documentation

## 2013-12-29 DIAGNOSIS — Z299 Encounter for prophylactic measures, unspecified: Secondary | ICD-10-CM

## 2013-12-29 DIAGNOSIS — I10 Essential (primary) hypertension: Secondary | ICD-10-CM

## 2013-12-29 DIAGNOSIS — E785 Hyperlipidemia, unspecified: Secondary | ICD-10-CM

## 2013-12-29 DIAGNOSIS — C61 Malignant neoplasm of prostate: Secondary | ICD-10-CM

## 2013-12-29 LAB — BASIC METABOLIC PANEL WITH GFR
BUN: 21 mg/dL (ref 6–23)
CO2: 28 meq/L (ref 19–32)
CREATININE: 1.12 mg/dL (ref 0.50–1.35)
Calcium: 9.1 mg/dL (ref 8.4–10.5)
Chloride: 104 mEq/L (ref 96–112)
GFR, EST AFRICAN AMERICAN: 73 mL/min
GFR, Est Non African American: 63 mL/min
GLUCOSE: 76 mg/dL (ref 70–99)
Potassium: 4.2 mEq/L (ref 3.5–5.3)
Sodium: 140 mEq/L (ref 135–145)

## 2013-12-29 MED ORDER — AMLODIPINE BESYLATE 10 MG PO TABS: 10.0000 mg | ORAL_TABLET | Freq: Every day | ORAL | Status: DC

## 2013-12-29 NOTE — Assessment & Plan Note (Signed)
BP 136/80 today, controlled, at goal.  Continue current regimen with amlodipine 10 mg daily and lisinopril 10 mg daily.  Sent in refill for amlodipine today (patient prefers 3 month prescriptions).  BMP today.

## 2013-12-29 NOTE — Assessment & Plan Note (Signed)
Continue pravastatin 20 mg daily. 

## 2013-12-29 NOTE — Assessment & Plan Note (Signed)
Pneumovax today.    Referred patient for colonoscopy as I thought last one in 2005.  However, on further chart review, appears that patient had repeat colonoscopy in 2012 thus he may not be due for one at this time.  Nurse to follow-up with Dr. Kathline Magic office.

## 2013-12-29 NOTE — Patient Instructions (Addendum)
Please follow-up in 6 months.   Keep up the good work taking your medicine as prescribed.  Your blood pressure is great today.  We sent in a refill for the medicine called AMLODIPINE.   We are checking a blood test to look at your electrolytes, I will let you know if the results are abnormal.   We referred you for colonoscopy.  They will call you to schedule the appointment time.   You got your pneumonia shot today!

## 2013-12-29 NOTE — Progress Notes (Signed)
Patient ID: [name], male   DOB: 1935/02/25, 79 y.o.   MRN: [id number]   Subjective:   Patient ID: [name] male   DOB: July 15, 1935 79 y.o.   MRN: [id number]  HPI: Streett Bunyard is a 79 y.o. man with history of HTN, HL, prostate cancer s/p radiation (follows with Dr. Jasmine December) who presents for routine follow-up.   Patient is doing quite well, no complaints today.  He just saw Dr. Jasmine December on 2/19 who told him his prostate cancer is cured.  PSA 0.29 at that time.  He is currently taking tamsulosin 0.8 mg qhs.  He will follow-up in one month.   BP 136/80 today.  He reports good compliance with amlodipine 10 mg daily and lisinopril 10 mg daily.  Denies headache, chest pain, nausea, dizziness.  He is requesting refill on amlodipine.   ROS negative (see below).   Past Medical History  Diagnosis Date  . Hypertension   . Hyperlipidemia   . Osteoarthrosis   . Cancer     Prostate  . Hemorrhoids   . GERD (gastroesophageal reflux disease)   . Inguinal hernia     left   Current Outpatient Prescriptions  Medication Sig Dispense Refill  . amLODipine (NORVASC) 10 MG tablet Take 1 tablet (10 mg total) by mouth daily.  90 tablet  1  . lisinopril (PRINIVIL,ZESTRIL) 10 MG tablet Take 1 tablet (10 mg total) by mouth daily.  90 tablet  1  . pravastatin (PRAVACHOL) 20 MG tablet Take 1 tablet (20 mg total) by mouth daily.  30 tablet  11  . tamsulosin (FLOMAX) 0.4 MG CAPS capsule Take 0.8 mg by mouth at bedtime.       No current facility-administered medications for this visit.   Family History  Problem Relation Age of Onset  . Cancer Mother   . Cancer Father    History   Social History  . Marital Status: Single    Spouse Name: N/A    Number of Children: N/A  . Years of Education: N/A   Social History Main Topics  . Smoking status: Former Smoker    Types: Cigarettes    Quit date: 01/27/1975  . Smokeless tobacco: None  . Alcohol Use: No  . Drug Use: No  . Sexual Activity: Yes   Partners: Female   Other Topics Concern  . None   Social History Narrative   Retired   Single   Former Smoker   Alcohol use- no   Drug use- no   Review of Systems: Review of Systems  Constitutional: Negative for fever and malaise/fatigue.  Eyes: Negative for blurred vision.  Respiratory: Negative for cough and shortness of breath.   Cardiovascular: Negative for chest pain and palpitations.  Gastrointestinal: Negative for nausea, vomiting, abdominal pain, diarrhea, constipation and blood in stool.  Genitourinary: Negative for dysuria.  Musculoskeletal: Negative for back pain and falls.  Neurological: Negative for dizziness, loss of consciousness, weakness and headaches.    Objective:  Physical Exam: Filed Vitals:   12/29/13 1316  BP: 136/80  Pulse: 69  Temp: 97.3 F (36.3 C)  TempSrc: Oral  Height: 5' 11.5" (1.816 m)  Weight: 175 lb 8 oz (79.606 kg)  SpO2: 99%   General: alert, cooperative, and in no apparent distress HEENT: NCAT, vision grossly intact, oropharynx clear and non-erythematous  Neck: supple, no lymphadenopathy Lungs: clear to ascultation bilaterally, normal work of respiration, no wheezes, rales, ronchi Heart: regular rate and rhythm, no murmurs, gallops, or rubs Abdomen:  soft, non-tender, non-distended, normal bowel sounds Extremities: 2+ DP/PT pulses bilaterally, no cyanosis, clubbing, or edema Neurologic: alert & oriented X3, cranial nerves II-XII intact, strength grossly intact, sensation intact to light touch  Assessment & Plan:  Patient discussed with Dr. Ellwood Dense.  Please see problem-based assessment and plan.

## 2013-12-29 NOTE — Assessment & Plan Note (Signed)
Per urologist's 12/21/13 note, appears that patient's prostate cancer is cured (treatment ended ~4 months ago).  PSA 0.29 on 12/13/13.  Patient currently taking tamsolosin 0.8 mg qhs.  Will follow-up with urology in 1 month and repeat PSA in 6 months.

## 2014-01-01 NOTE — Progress Notes (Signed)
Case discussed with Dr. Rogers at the time of the visit.  We reviewed the resident's history and exam and pertinent patient test results.  I agree with the assessment, diagnosis, and plan of care documented in the resident's note. 

## 2014-01-17 ENCOUNTER — Other Ambulatory Visit: Payer: Self-pay | Admitting: Internal Medicine

## 2014-06-15 ENCOUNTER — Other Ambulatory Visit: Payer: Self-pay | Admitting: *Deleted

## 2014-06-15 DIAGNOSIS — I10 Essential (primary) hypertension: Secondary | ICD-10-CM

## 2014-06-18 MED ORDER — LISINOPRIL 10 MG PO TABS: 10.0000 mg | ORAL_TABLET | Freq: Every day | ORAL | Status: DC

## 2014-07-04 ENCOUNTER — Encounter: Payer: Self-pay | Admitting: Internal Medicine

## 2014-07-04 ENCOUNTER — Ambulatory Visit (INDEPENDENT_AMBULATORY_CARE_PROVIDER_SITE_OTHER): Payer: PRIVATE HEALTH INSURANCE | Admitting: Internal Medicine

## 2014-07-04 VITALS — BP 127/72 | HR 73 | Temp 98.1°F | Wt 180.6 lb

## 2014-07-04 DIAGNOSIS — H1013 Acute atopic conjunctivitis, bilateral: Secondary | ICD-10-CM

## 2014-07-04 DIAGNOSIS — H1045 Other chronic allergic conjunctivitis: Secondary | ICD-10-CM

## 2014-07-04 DIAGNOSIS — Z23 Encounter for immunization: Secondary | ICD-10-CM

## 2014-07-04 DIAGNOSIS — I10 Essential (primary) hypertension: Secondary | ICD-10-CM

## 2014-07-04 DIAGNOSIS — Z8546 Personal history of malignant neoplasm of prostate: Secondary | ICD-10-CM

## 2014-07-04 DIAGNOSIS — J309 Allergic rhinitis, unspecified: Secondary | ICD-10-CM

## 2014-07-04 DIAGNOSIS — E785 Hyperlipidemia, unspecified: Secondary | ICD-10-CM

## 2014-07-04 LAB — LIPID PANEL
CHOLESTEROL: 192 mg/dL (ref 0–200)
HDL: 58 mg/dL (ref >39)
LDL CALC: 119 mg/dL — AB (ref 0–99)
TRIGLYCERIDES: 74 mg/dL (ref <150)
Total CHOL/HDL Ratio: 3.3 Ratio
VLDL: 15 mg/dL (ref 0–40)

## 2014-07-04 MED ORDER — AMLODIPINE BESYLATE 10 MG PO TABS: ORAL_TABLET | ORAL | Status: DC

## 2014-07-04 MED ORDER — PRAVASTATIN SODIUM 20 MG PO TABS: 20.0000 mg | ORAL_TABLET | Freq: Every day | ORAL | Status: DC

## 2014-07-04 MED ORDER — LISINOPRIL 10 MG PO TABS: 10.0000 mg | ORAL_TABLET | Freq: Every day | ORAL | Status: DC

## 2014-07-04 MED ORDER — TAMSULOSIN HCL 0.4 MG PO CAPS: 0.8000 mg | ORAL_CAPSULE | Freq: Every day | ORAL | Status: DC

## 2014-07-04 NOTE — Progress Notes (Signed)
Subjective:     Patient ID: [name], male   DOB: 1935/01/24, 79 y.o.   MRN: [id number]  HPI  [name] is a 79 y.o. man with history of HTN, HL, prostate cancer s/p radiation (follows with Dr. Jasmine December) who presents for routine follow-up. Patient complains of itchy, watery, irritated eyes. He feels like he has "trash in them." He states his eyes have been like this for about 2 weeks. He gets these symptoms yearly, but has never taken a medication for them. He denies any fever, chills, nasal congestion, sinus congestion or pressure, cough or sneezing.   Otherwise, he states he is doing very well and has no complaints. He is taking all of his medications as prescribed. He loves to walk and walks about 2 miles every day.   Review of Systems General: Denies fever, chills, fatigue, change in appetite and diaphoresis.  Eyes: Admits to redness, tearing, irritation, itchy. Denies crusting or pain.  Respiratory: Denies SOB, cough, DOE, chest tightness, and wheezing.   Cardiovascular: Denies chest pain and palpitations.  Gastrointestinal: Denies nausea, vomiting, abdominal pain, diarrhea, constipation, blood in stool and abdominal distention.  Genitourinary: Admits to urinary frequency and occasional urinary incontinence. Denies dysuria, urgency, hematuria, suprapubic pain and flank pain. Endocrine: Denies hot or cold intolerance, polyuria, and polydipsia. Musculoskeletal: Denies myalgias, back pain, joint swelling, arthralgias and gait problem.  Skin: Denies pallor, rash and wounds.  Neurological: Denies dizziness, headaches, weakness, lightheadedness, numbness,seizures, and syncope, Psychiatric/Behavioral: Denies mood changes, confusion, nervousness, sleep disturbance and agitation.      Objective:   Physical Exam Filed Vitals:   07/04/14 1505  BP: 127/72  Pulse: 73  Temp: 98.1 F (36.7 C)  TempSrc: Oral  Weight: 180 lb 9.6 oz (81.92 kg)  SpO2: 100%   General: Vital signs  reviewed.  Patient is well-developed and well-nourished, in no acute distress and cooperative with exam, appears younger than stated age.  Eyes: EOMI, erythematous conjunctivae with lacrimation, no scleral icterus.  Cardiovascular: RRR, S1 normal, S2 normal, no murmurs, gallops, or rubs. Pulmonary/Chest: Clear to auscultation bilaterally, no wheezes, rales, or rhonchi. Abdominal: Soft, non-tender, non-distended, BS +, no masses, organomegaly, or guarding present.  Musculoskeletal: No joint deformities, erythema, or stiffness, ROM full and nontender. Extremities: No lower extremity edema bilaterally,  pulses symmetric and intact bilaterally. No cyanosis or clubbing. Skin: Warm, dry and intact. No rashes or erythema. Psychiatric: Normal mood and affect. speech and behavior is normal. Cognition and memory are normal.       Assessment:     Please see problem based assessment and plan.     Plan:

## 2014-07-04 NOTE — Patient Instructions (Signed)
General Instructions:   Thank you for bringing your medicines today. This helps [place] keep you safe from mistakes.  Please continue taking all of your medications as prescribed.  For your allergic conjuctivitis (irritated, red eyes), you can obtain SALINE HYDRATING EYE DROPS from your pharmacy. This will help improve your symptoms.   Your blood pressure is well controlled. It was 127/72 today. Please continue taking your medications and continue exercising regularly and eating healthy.  Self Care Goals & Plans:  Self Care Goal 11/16/2012  Manage my medications take my medicines as prescribed; bring my medications to every visit; refill my medications on time; follow the sick day instructions if I am sick  Monitor my health keep track of my weight  Eat healthy foods eat fruit for snacks and desserts; eat more vegetables; eat smaller portions; eat baked foods instead of fried foods; drink diet soda or water instead of juice or soda  Be physically active take a walk every day; find an activity I enjoy    No flowsheet data found.    Allergic Conjunctivitis The conjunctiva is a thin membrane that covers the visible white part of the eyeball and the underside of the eyelids. This membrane protects and lubricates the eye. The membrane has small blood vessels running through it that can normally be seen. When the conjunctiva becomes inflamed, the condition is called conjunctivitis. In response to the inflammation, the conjunctival blood vessels become swollen. The swelling results in redness in the normally white part of the eye. The blood vessels of this membrane also react when a person has allergies and is then called allergic conjunctivitis. This condition usually lasts for as long as the allergy persists. Allergic conjunctivitis cannot be passed to another person (non-contagious). The likelihood of bacterial infection is great and the cause is not likely due to allergies if the inflamed eye  has:  A sticky discharge.  Discharge or sticking together of the lids in the morning.  Scaling or flaking of the eyelids where the eyelashes come out.  Red swollen eyelids. CAUSES   Viruses.  Irritants such as foreign bodies.  Chemicals.  General allergic reactions.  Inflammation or serious diseases in the inside or the outside of the eye or the orbit (the boney cavity in which the eye sits) can cause a "red eye." SYMPTOMS   Eye redness.  Tearing.  Itchy eyes.  Burning feeling in the eyes.  Clear drainage from the eye.  Allergic reaction due to pollens or ragweed sensitivity. Seasonal allergic conjunctivitis is frequent in the spring when pollens are in the air and in the fall. DIAGNOSIS  This condition, in its many forms, is usually diagnosed based on the history and an ophthalmological exam. It usually involves both eyes. If your eyes react at the same time every year, allergies may be the cause. While most "red eyes" are due to allergy or an infection, the role of an eye (ophthalmological) exam is important. The exam can rule out serious diseases of the eye or orbit. TREATMENT   Non-antibiotic eye drops, ointments, or medications by mouth may be prescribed if the ophthalmologist is sure the conjunctivitis is due to allergies alone.  Over-the-counter drops and ointments for allergic symptoms should be used only after other causes of conjunctivitis have been ruled out, or as your caregiver suggests. Medications by mouth are often prescribed if other allergy-related symptoms are present. If the ophthalmologist is sure that the conjunctivitis is due to allergies alone, treatment is normally limited  to drops or ointments to reduce itching and burning. HOME CARE INSTRUCTIONS   Wash hands before and after applying drops or ointments, or touching the inflamed eye(s) or eyelids.  Do not let the eye dropper tip or ointment tube touch the eyelid when putting medicine in your  eye.  Stop using your soft contact lenses and throw them away. Use a new pair of lenses when recovery is complete. You should run through sterilizing cycles at least three times before use after complete recovery if the old soft contact lenses are to be used. Hard contact lenses should be stopped. They need to be thoroughly sterilized before use after recovery.  Itching and burning eyes due to allergies is often relieved by using a cool cloth applied to closed eye(s). SEEK MEDICAL CARE IF:   Your problems do not go away after two or three days of treatment.  Your lids are sticky (especially in the morning when you wake up) or stick together.  Discharge develops. Antibiotics may be needed either as drops, ointment, or by mouth.  You have extreme light sensitivity.  An oral temperature above 102 F (38.9 C) develops.  Pain in or around the eye or any other visual symptom develops. MAKE SURE YOU:   Understand these instructions.  Will watch your condition.  Will get help right away if you are not doing well or get worse. Document Released: 01/09/2003 Document Revised: 01/11/2012 Document Reviewed: 12/05/2007 [facility] Patient Information 2015 [place], [place]. This information is not intended to replace advice given to you by your health care provider. Make sure you discuss any questions you have with your health care provider.

## 2014-07-04 NOTE — Assessment & Plan Note (Signed)
Assessment: Patient has yearly symptoms of erythematous, irritated eyes with lacrimation that lasts for about 4 weeks. He has never taken medication for his symptoms. He denies any nasal or sinus congestion, sneezing, cough, headache, fever or chills.   Plan: Patient is likely on the end of his course. I recommended trying saline eye drops over the counter for symptom control. In the future, we could try the patient on allergy medications before or early on into his course of symptoms.

## 2014-07-04 NOTE — Assessment & Plan Note (Signed)
BP Readings from Last 3 Encounters:  07/04/14 127/72  12/29/13 136/80  11/16/12 125/72    Lab Results  Component Value Date   NA 140 12/29/2013   K 4.2 12/29/2013   CREATININE 1.12 12/29/2013    Assessment: Blood pressure control:  At goal Progress toward BP goal:   Stable Comments: Patient walks 2 miles every day.  Plan: Medications:  continue current medications Educational resources provided:   Self management tools provided:   Other plans: Continue lisinopril 10 mg daily and amlodipine 10 mg daily. Continue exercising and eating healthy.

## 2014-07-04 NOTE — Assessment & Plan Note (Deleted)
Assessment: Patient has yearly symptoms of erythematous, irritated eyes with lacrimation that lasts for about 4 weeks. He has never taken medication for his symptoms. He denies any nasal or sinus congestion, sneezing, cough, headache, fever or chills.   Plan: Patient is likely on the end of his course. I recommended trying saline eye drops over the counter for symptom control. In the future, we could try the patient on allergy medications before or early on into his course of symptoms.

## 2014-07-04 NOTE — Assessment & Plan Note (Signed)
Assessment: Last lipid profile on 01/27/2011 showed the results shown below. His cholesterol has been well controlled on pravastatin 20 mg daily.   Ref. Range 01/27/2011 15:55  Cholesterol Latest Range: 0-200 mg/dL 185  Triglycerides Latest Range: <150 mg/dL 63  HDL Latest Range: >39 mg/dL 61  LDL (calc) Latest Range: 0-99 mg/dL 111 (H)  VLDL Latest Range: 0-40 mg/dL 13  Total CHOL/HDL Ratio No range found 3.0   Plan: We will continue pravastatin 20 mg daily and recheck a lipid profile today.

## 2014-07-05 NOTE — Progress Notes (Signed)
I saw and evaluated the patient.  I personally confirmed the key portions of the history and exam documented by Dr. Marvel Plan and I reviewed pertinent patient test results.  The assessment, diagnosis, and plan were formulated together and I agree with the documentation in the resident's note.

## 2015-02-04 DIAGNOSIS — C61 Malignant neoplasm of prostate: Secondary | ICD-10-CM | POA: Diagnosis not present

## 2015-02-04 DIAGNOSIS — Z8546 Personal history of malignant neoplasm of prostate: Secondary | ICD-10-CM | POA: Diagnosis not present

## 2015-03-06 ENCOUNTER — Ambulatory Visit: Payer: Medicaid Other | Admitting: Pulmonary Disease

## 2015-04-30 ENCOUNTER — Other Ambulatory Visit: Payer: Self-pay | Admitting: Internal Medicine

## 2015-06-26 ENCOUNTER — Ambulatory Visit (INDEPENDENT_AMBULATORY_CARE_PROVIDER_SITE_OTHER): Payer: Medicare Other | Admitting: Internal Medicine

## 2015-06-26 ENCOUNTER — Encounter: Payer: Self-pay | Admitting: Internal Medicine

## 2015-06-26 VITALS — BP 101/60 | HR 78 | Temp 98.1°F | Ht 71.5 in | Wt 181.8 lb

## 2015-06-26 DIAGNOSIS — I10 Essential (primary) hypertension: Secondary | ICD-10-CM

## 2015-06-26 DIAGNOSIS — R200 Anesthesia of skin: Secondary | ICD-10-CM

## 2015-06-26 DIAGNOSIS — Z8546 Personal history of malignant neoplasm of prostate: Secondary | ICD-10-CM | POA: Diagnosis not present

## 2015-06-26 DIAGNOSIS — B351 Tinea unguium: Secondary | ICD-10-CM | POA: Diagnosis not present

## 2015-06-26 DIAGNOSIS — C61 Malignant neoplasm of prostate: Secondary | ICD-10-CM | POA: Diagnosis not present

## 2015-06-26 DIAGNOSIS — R202 Paresthesia of skin: Secondary | ICD-10-CM | POA: Diagnosis not present

## 2015-06-26 DIAGNOSIS — D126 Benign neoplasm of colon, unspecified: Secondary | ICD-10-CM

## 2015-06-26 DIAGNOSIS — Z299 Encounter for prophylactic measures, unspecified: Secondary | ICD-10-CM

## 2015-06-26 LAB — POCT GLYCOSYLATED HEMOGLOBIN (HGB A1C): Hemoglobin A1C: 5.8

## 2015-06-26 LAB — GLUCOSE, CAPILLARY: Glucose-Capillary: 83 mg/dL (ref 65–99)

## 2015-06-26 MED ORDER — CICLOPIROX 8 % EX SOLN: Freq: Every day | CUTANEOUS | Status: DC

## 2015-06-26 NOTE — Progress Notes (Signed)
   Subjective:    Patient ID: [name], male    DOB: 26-Nov-1934, 80 y.o.   MRN: [id number]  HPI [name] is a 80 yo male with PMHx of HTN, HLD, prostate cancer s/p radiation who presents for follow up for HTN. Please see problem oriented assessment and plan for more information.  Review of Systems General: Denies fever, chills, fatigue, change in appetite and diaphoresis.  Respiratory: Denies SOB, cough, DOE   Cardiovascular: Denies chest pain and palpitations.  Gastrointestinal: Denies abdominal pain, diarrhea, constipation, blood in stool and abdominal distention.  Genitourinary: Denies urgency, frequency, hematuria Skin: Admits to toenail fungus. Denies pallor, rash and wounds.  Neurological: Admits to intermittent numbness and tingling in right hand and right foot. Denies dizziness, headaches, weakness, lightheadedness  Past Medical History  Diagnosis Date  . Hypertension   . Hyperlipidemia   . Osteoarthrosis   . Cancer     Prostate  . Hemorrhoids   . GERD (gastroesophageal reflux disease)   . Inguinal hernia     left   Outpatient Encounter Prescriptions as of 06/26/2015  Medication Sig  . amLODipine (NORVASC) 10 MG tablet TAKE 1 TABLET BY MOUTH EVERY DAY  . lisinopril (PRINIVIL,ZESTRIL) 10 MG tablet Take 1 tablet (10 mg total) by mouth daily.  . pravastatin (PRAVACHOL) 20 MG tablet Take 1 tablet (20 mg total) by mouth daily.  . tamsulosin (FLOMAX) 0.4 MG CAPS capsule Take 2 capsules (0.8 mg total) by mouth at bedtime.   No facility-administered encounter medications on file as of 06/26/2015.      Objective:   Physical Exam Filed Vitals:   06/26/15 1454  BP: 101/60  Pulse: 78  Temp: 98.1 F (36.7 C)  TempSrc: Oral  Height: 5' 11.5" (1.816 m)  Weight: 181 lb 12.8 oz (82.464 kg)  SpO2: 99%   General: Vital signs reviewed.  Patient is well-developed and well-nourished, in no acute distress and cooperative with exam.  Neck: Supple, no JVD or carotid bruit present.   Cardiovascular: RRR, S1 normal, S2 normal, no murmurs, gallops, or rubs. Pulmonary/Chest: Clear to auscultation bilaterally, no wheezes, rales, or rhonchi. Abdominal: Soft, non-tender, non-distended, BS + Extremities: No lower extremity edema bilaterally, 2+ radial and pedal pulses symmetric and intact bilaterally.  Neurological: A&O x3, Strength is normal and symmetric bilaterally, sensory intact to light touch bilaterally.  Skin: +tinea pedis, worst on right great toe. Psychiatric: Normal mood and affect. speech and behavior is normal. Cognition and memory are normal.     Assessment & Plan:   Please see problem oriented assessment and plan.

## 2015-06-27 LAB — CBC
Hematocrit: 35.7 % — ABNORMAL LOW (ref 37.5–51.0)
Hemoglobin: 12 g/dL — ABNORMAL LOW (ref 12.6–17.7)
MCH: 30.5 pg (ref 26.6–33.0)
MCHC: 33.6 g/dL (ref 31.5–35.7)
MCV: 91 fL (ref 79–97)
Platelets: 223 10*3/uL (ref 150–379)
RBC: 3.93 x10E6/uL — AB (ref 4.14–5.80)
RDW: 13 % (ref 12.3–15.4)
WBC: 5.7 10*3/uL (ref 3.4–10.8)

## 2015-06-27 LAB — BASIC METABOLIC PANEL
BUN/Creatinine Ratio: 15 (ref 10–22)
BUN: 18 mg/dL (ref 8–27)
CALCIUM: 9.2 mg/dL (ref 8.6–10.2)
CHLORIDE: 102 mmol/L (ref 97–108)
CO2: 24 mmol/L (ref 18–29)
Creatinine, Ser: 1.23 mg/dL (ref 0.76–1.27)
GFR calc Af Amer: 65 mL/min/{1.73_m2} (ref >59)
GFR calc non Af Amer: 56 mL/min/{1.73_m2} — ABNORMAL LOW (ref >59)
GLUCOSE: 86 mg/dL (ref 65–99)
POTASSIUM: 4.5 mmol/L (ref 3.5–5.2)
Sodium: 143 mmol/L (ref 134–144)

## 2015-06-27 NOTE — Assessment & Plan Note (Signed)
Patient describes several months of intermittent numbness and tingling in his right hand and right foot which occur briefly once every few days. They symptoms in his hand improve after he makes a fist several times. These symptoms are not bothersome to him. Physical exam is unremarkable- normal sensation, normal strength, normal appearance, and 2+ radial and pedal pulses. HgbA1c 5.8, r/o diabetic neuropathy. Doubt Raynaud's given symptoms occur in heat, not cold. Doubt Carpal Tunnel as symptoms also occur in right foot. Possibly secondary to neuropathy versus radiculopathy.  Plan: -Since symptoms are not bothersome to patient, we will continue to monitor

## 2015-06-27 NOTE — Assessment & Plan Note (Addendum)
Last colonoscopy completed in 2012, images are available in Epic but the report and pathology are not visible. Patient does have a hx of colonic polyps. Denies any symptoms melena, hematochezia, or unexpected weight loss.   Discussed abdominal ultrasound screening. Patient declined.  Plan: -Obtain records from Dr. 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 No polyps. Recommend follow up was prn. Given patient is over 89, he is done with colonoscopies unless an issue arises.

## 2015-06-27 NOTE — Assessment & Plan Note (Signed)
BP Readings from Last 3 Encounters:  06/26/15 101/60  07/04/14 127/72  12/29/13 136/80    Lab Results  Component Value Date   NA 143 06/26/2015   K 4.5 06/26/2015   CREATININE 1.23 06/26/2015    Assessment: Blood pressure control:  Well controlled Progress toward BP goal:   At goal Comments: Compliant with lisinopril 10 mg daily and amlodipine 10 mg daily. No symptoms of dizziness, lightheadedness.  Plan: Medications:  continue current medications Other plans: Discussed to call if patient develops orthostatic hypotensive symptoms and to hold medications during times off GI illness or decreased po intake.

## 2015-06-27 NOTE — Assessment & Plan Note (Signed)
Patient follows closely with Urology once a year. He states his results have always been good. He denies any hematuria.

## 2015-06-27 NOTE — Assessment & Plan Note (Signed)
Evidence of Onychomycosis on all toenails, but worst with deformities on right great toe. No evidence of cellulitis.  Plan: -Ciclopirox solution -Referral to Podiatrist

## 2015-07-01 NOTE — Progress Notes (Signed)
Internal Medicine Clinic Attending  Case discussed with Dr. Richardson at the time of the visit.  We reviewed the resident's history and exam and pertinent patient test results.  I agree with the assessment, diagnosis, and plan of care documented in the resident's note. 

## 2015-07-10 ENCOUNTER — Encounter: Payer: Self-pay | Admitting: *Deleted

## 2015-07-31 ENCOUNTER — Encounter: Payer: Self-pay | Admitting: *Deleted

## 2015-08-20 ENCOUNTER — Other Ambulatory Visit: Payer: Self-pay | Admitting: Internal Medicine

## 2015-08-28 NOTE — Addendum Note (Signed)
Addended by: Hulan Fray on: 08/28/2015 05:55 PM   Modules accepted: Orders

## 2015-10-01 ENCOUNTER — Encounter: Payer: Self-pay | Admitting: Internal Medicine

## 2015-10-01 ENCOUNTER — Ambulatory Visit (INDEPENDENT_AMBULATORY_CARE_PROVIDER_SITE_OTHER): Payer: Medicare Other | Admitting: Internal Medicine

## 2015-10-01 VITALS — BP 142/66 | HR 56 | Temp 98.2°F | Ht 71.5 in | Wt 179.3 lb

## 2015-10-01 DIAGNOSIS — N5082 Scrotal pain: Secondary | ICD-10-CM | POA: Diagnosis not present

## 2015-10-01 DIAGNOSIS — Z8546 Personal history of malignant neoplasm of prostate: Secondary | ICD-10-CM | POA: Diagnosis not present

## 2015-10-01 DIAGNOSIS — I10 Essential (primary) hypertension: Secondary | ICD-10-CM

## 2015-10-01 DIAGNOSIS — B351 Tinea unguium: Secondary | ICD-10-CM | POA: Diagnosis not present

## 2015-10-01 DIAGNOSIS — Z79899 Other long term (current) drug therapy: Secondary | ICD-10-CM

## 2015-10-01 DIAGNOSIS — C61 Malignant neoplasm of prostate: Secondary | ICD-10-CM | POA: Diagnosis not present

## 2015-10-01 DIAGNOSIS — E785 Hyperlipidemia, unspecified: Secondary | ICD-10-CM

## 2015-10-01 DIAGNOSIS — Z923 Personal history of irradiation: Secondary | ICD-10-CM

## 2015-10-01 DIAGNOSIS — K4090 Unilateral inguinal hernia, without obstruction or gangrene, not specified as recurrent: Secondary | ICD-10-CM | POA: Insufficient documentation

## 2015-10-01 DIAGNOSIS — Z299 Encounter for prophylactic measures, unspecified: Secondary | ICD-10-CM

## 2015-10-01 NOTE — Progress Notes (Signed)
Subjective:    Patient ID: [name], male    DOB: 1935/05/21, 80 y.o.   MRN: [id number]  HPI [name] is a 80 y.o. male with PMHx of HTN, HLD, prostate cancer, OA who presents to the clinic for follow up for HTN. Please see A&P for the status of the patient's chronic medical problems.   Past Medical History  Diagnosis Date  . Hypertension   . Hyperlipidemia   . Osteoarthrosis   . Cancer [facility])     Prostate  . Hemorrhoids   . GERD (gastroesophageal reflux disease)   . Inguinal hernia     left    Outpatient Encounter Prescriptions as of 10/01/2015  Medication Sig  . amLODipine (NORVASC) 10 MG tablet TAKE 1 TABLET BY MOUTH EVERY DAY  . ciclopirox (PENLAC) 8 % solution Apply topically at bedtime. Apply over nail and surrounding skin. Apply daily over previous coat. After seven (7) days, may remove with alcohol and continue cycle.  Marland Kitchen lisinopril (PRINIVIL,ZESTRIL) 10 MG tablet Take 1 tablet (10 mg total) by mouth daily.  . pravastatin (PRAVACHOL) 20 MG tablet Take 1 tablet (20 mg total) by mouth daily.  . tamsulosin (FLOMAX) 0.4 MG CAPS capsule Take 2 capsules (0.8 mg total) by mouth at bedtime.   No facility-administered encounter medications on file as of 10/01/2015.    Family History  Problem Relation Age of Onset  . Cancer Mother   . Cancer Father     Social History   Social History  . Marital Status: Single    Spouse Name: N/A  . Number of Children: N/A  . Years of Education: N/A   Occupational History  . Not on file.   Social History Main Topics  . Smoking status: Former Smoker    Types: Cigarettes    Quit date: 01/27/1975  . Smokeless tobacco: Not on file  . Alcohol Use: No  . Drug Use: No  . Sexual Activity: Not on file   Other Topics Concern  . Not on file   Social History Narrative   Retired   Single   Former Smoker   Alcohol use- no   Drug use- no    Review of Systems General: Denies fever, chills, fatigue.  Respiratory: Admits to  occasional SOB. Denies cough, DOE, chest tightness, and wheezing.   Cardiovascular: Denies chest pain and palpitations.  Gastrointestinal: Denies nausea, vomiting, abdominal pain, diarrhea, constipation, blood in stool and abdominal distention.  Genitourinary: Admits to right scrotal pain and swelling. Denies dysuria Neurological: Denies dizziness, headaches, weakness, lightheadedness    Objective:   Physical Exam Filed Vitals:   10/01/15 1524  BP: 142/66  Pulse: 56  Temp: 98.2 F (36.8 C)  TempSrc: Oral  Height: 5' 11.5" (1.816 m)  Weight: 179 lb 4.8 oz (81.33 kg)  SpO2: 98%   General: Vital signs reviewed.  Patient is elderly, in no acute distress and cooperative with exam.  Cardiovascular: Bradycardic, regular rhythm, S1 normal, S2 normal Pulmonary/Chest: Clear to auscultation bilaterally, no wheezes, rales, or rhonchi. Abdominal: Soft, non-tender, non-distended, BS +  Scrotum: Large, hard mass palpated in right scrotum, unable to reduce. Some evidence of illumination of fluid under light. Unable to palpate separate testicle from the mass. Palpable seminal vesicle on posterior mass. Tender on palpation. Extremities: No lower extremity edema bilaterally Psychiatric: Normal mood and affect. speech and behavior is normal. Cognition and memory are normal.     Assessment & Plan:   Please see problem based assessment and  plan.

## 2015-10-01 NOTE — Patient Instructions (Signed)
CONTINUE TAKING ALL OF YOUR MEDICATIONS THE SAME.  WE WILL REFER YOU TO GENERAL SURGERY FOR YOUR RIGHT SIDED HERNIA.  FOLLOW UP IN 6 MONTHS.

## 2015-10-01 NOTE — Assessment & Plan Note (Signed)
Patient did pick up Penlac, but has not used it as he felt funny putting on a polish. He will consider using it on his toenails.

## 2015-10-01 NOTE — Assessment & Plan Note (Signed)
Patient follows with Urology once a year. He is currently in remission as he is s/p radiation in 2011. He is in tamsulosin 0.8 mg daily as prescribed by his Urologist.

## 2015-10-01 NOTE — Assessment & Plan Note (Signed)
BP Readings from Last 3 Encounters:  10/01/15 142/66  06/26/15 101/60  07/04/14 127/72    Lab Results  Component Value Date   NA 143 06/26/2015   K 4.5 06/26/2015   CREATININE 1.23 06/26/2015    Assessment: Blood pressure control:  Controlled Progress toward BP goal:   At goal Comments: Compliant with amlodipine 10 mg daily, lisinopril 10 mg daily, and tamsulosin 0.8 mg daily.   Plan: Medications:  continue current medications

## 2015-10-01 NOTE — Assessment & Plan Note (Signed)
Flu Shot: Patient has already received flu shot this year.

## 2015-10-01 NOTE — Assessment & Plan Note (Signed)
Cholesterol well controlled on last lipid panel while taking Pravastatin 20 mg daily.  Plan: -Continue pravastatin 20 mg daily

## 2015-10-01 NOTE — Assessment & Plan Note (Addendum)
Patient describes pain and swelling in his right scrotum for the past 6 months. It has been more painful in the last 6 months. He has a history of left inguinal hernia s/p repair without recurrence. He denies fever, chills, nausea, vomiting or constipation. He is having normal bowel movement and passing flatus. Physical exam does not confirm inguinal hernia as the testicle is difficult to palpate. DDx includes non-reducible right inguinal hernia, hydrocele or testicular mass.   Plan: -Scrotum ultrasound -Referral to general surgery versus urology based on results  Addendum: Scrotal ultrasound reveals large right sided inguinal hernia with hydrocele. No evidence of mass. Will refer to general surgery.

## 2015-10-02 NOTE — Progress Notes (Signed)
I saw and evaluated the patient.  I personally confirmed the key portions of Dr. Richardson's history and exam and reviewed pertinent patient test results.  The assessment, diagnosis, and plan were formulated together and I agree with the documentation in the resident's note. 

## 2015-10-21 ENCOUNTER — Other Ambulatory Visit: Payer: Self-pay | Admitting: Internal Medicine

## 2015-10-21 ENCOUNTER — Ambulatory Visit (HOSPITAL_COMMUNITY)
Admission: RE | Admit: 2015-10-21 | Discharge: 2015-10-21 | Disposition: A | Discharge status: Home / Self Care | Payer: Medicare Other | Source: Ambulatory Visit | Attending: Internal Medicine | Admitting: Internal Medicine

## 2015-10-21 ENCOUNTER — Ambulatory Visit (HOSPITAL_COMMUNITY): Payer: Medicare Other

## 2015-10-21 DIAGNOSIS — I861 Scrotal varices: Secondary | ICD-10-CM | POA: Insufficient documentation

## 2015-10-21 DIAGNOSIS — N5082 Scrotal pain: Secondary | ICD-10-CM

## 2015-10-21 DIAGNOSIS — K4090 Unilateral inguinal hernia, without obstruction or gangrene, not specified as recurrent: Secondary | ICD-10-CM | POA: Diagnosis not present

## 2015-10-21 DIAGNOSIS — N433 Hydrocele, unspecified: Secondary | ICD-10-CM | POA: Insufficient documentation

## 2015-10-22 NOTE — Addendum Note (Signed)
Addended by: Vickii Chafe on: 10/22/2015 08:18 PM   Modules accepted: Orders

## 2015-10-23 ENCOUNTER — Telehealth: Payer: Self-pay | Admitting: *Deleted

## 2015-10-23 NOTE — Telephone Encounter (Signed)
-----   Message from Vickii Chafe, MD sent at 10/22/2015  8:18 PM EST ----- Regarding: Inguinal Hernia Hi Jaime Grizzell,  I wanted to let you know I placed a referral to general surgery for this patient. When you have time, do you mind calling the patient to let him know his ultrasound showed a hernia (like we thought) and we will refer him to a surgeon?  Thank you! [place]

## 2015-10-23 NOTE — Telephone Encounter (Signed)
Pt called - not at home; spoke to Garrison, his significant other. Informed her, pt's U/S showed a hernia and we will refer him to a surgeon per Dr Marvel Plan. And we will call him with the appt. Stated she will let him know.

## 2015-11-05 ENCOUNTER — Telehealth: Payer: Self-pay | Admitting: Internal Medicine

## 2015-11-05 DIAGNOSIS — I10 Essential (primary) hypertension: Secondary | ICD-10-CM

## 2015-11-05 MED ORDER — LISINOPRIL 10 MG PO TABS: 10.0000 mg | ORAL_TABLET | Freq: Every day | ORAL | Status: DC

## 2015-11-05 NOTE — Telephone Encounter (Signed)
Patient requesting a refill on her lisinopril

## 2015-11-05 NOTE — Telephone Encounter (Signed)
Refilled Lisinopril. 

## 2015-11-19 ENCOUNTER — Ambulatory Visit: Payer: Self-pay | Admitting: Surgery

## 2015-11-19 DIAGNOSIS — K4090 Unilateral inguinal hernia, without obstruction or gangrene, not specified as recurrent: Secondary | ICD-10-CM | POA: Diagnosis not present

## 2015-11-19 DIAGNOSIS — N433 Hydrocele, unspecified: Secondary | ICD-10-CM | POA: Diagnosis not present

## 2015-11-19 DIAGNOSIS — K429 Umbilical hernia without obstruction or gangrene: Secondary | ICD-10-CM | POA: Diagnosis not present

## 2015-11-19 NOTE — H&P (Signed)
History of Present Illness [name]. Clifford Mcduffey Stuart; 11/19/2015 2:21 PM) Patient words: hernia.  The patient is a 81 year old male who presents with an inguinal hernia. Referred by Dr. Oval Stuart for evaluation of right inguinal hernia and right hydrocele This is a 81 year old male who is 4 years status post left inguinal hernia repair with mesh for a large direct inguinal hernia. The patient is doing well from that standpoint. A couple of years ago, he began noticing a bulge in his right groin. This has become fairly large. This becomes uncomfortable extending down into the scrotum. He was recently evaluated with an ultrasound of the scrotum which showed a large right inguinal hernia as well as a right hydrocele. The patient denies any obstructive symptoms. The hernia reduces and is more comfortable when he is supine.   Other Problems (Clifford Eversole, LPN; [id number] PM) Arthritis Bladder Problems High blood pressure Hypercholesterolemia Inguinal Hernia Umbilical Hernia Repair  Past Surgical History (Clifford Eversole, LPN; [id number] PM) Open Inguinal Hernia Surgery Left.  Allergies (Clifford Eversole, LPN; D34-534 075-GRM PM) Penicillins  Medication History (Clifford Eversole, LPN; D34-534 075-GRM PM) AmLODIPine Besylate (10MG  Tablet, Oral) Active. Ciclopirox (8% Solution, External) Active. Lisinopril (10MG  Tablet, Oral) Active. Pravastatin Sodium (20MG  Tablet, Oral) Active. Tamsulosin HCl (0.4MG  Capsule, Oral) Active. Medications Reconciled  Social History (Clifford Borer, LPN; [id number] 579FGE PM) Alcohol use Remotely quit alcohol use. Caffeine use Coffee. No drug use Tobacco use Former smoker.  Family History Clifford Borer, LPN; [id number] 579FGE PM) Arthritis Brother, Father, Mother, Sister. Colon Cancer Brother, Father, Mother, Sister. Hypertension Brother, Father, Mother, Sister. Prostate Cancer Brother, Father.     Review of Systems (Clifford  Eversole LPN; [id number] 579FGE PM) General Not Present- Appetite Loss, Chills, Fatigue, Fever, Night Sweats, Weight Gain and Weight Loss. Skin Not Present- Change in Wart/Mole, Dryness, Hives, Jaundice, New Lesions, Non-Healing Wounds, Rash and Ulcer. HEENT Present- Hearing Loss and Wears glasses/contact lenses. Not Present- Earache, Hoarseness, Nose Bleed, Oral Ulcers, Ringing in the Ears, Seasonal Allergies, Sinus Pain, Sore Throat, Visual Disturbances and Yellow Eyes. Respiratory Not Present- Bloody sputum, Chronic Cough, Difficulty Breathing, Snoring and Wheezing. Breast Not Present- Breast Mass, Breast Pain, Nipple Discharge and Skin Changes. Cardiovascular Not Present- Chest Pain, Difficulty Breathing Lying Down, Leg Cramps, Palpitations, Rapid Heart Rate, Shortness of Breath and Swelling of Extremities. Gastrointestinal Present- Excessive gas. Not Present- Abdominal Pain, Bloating, Bloody Stool, Change in Bowel Habits, Chronic diarrhea, Constipation, Difficulty Swallowing, Gets full quickly at meals, Hemorrhoids, Indigestion, Nausea, Rectal Pain and Vomiting. Male Genitourinary Present- Frequency, Nocturia, Urgency and Urine Leakage. Not Present- Blood in Urine, Change in Urinary Stream, Impotence and Painful Urination. Musculoskeletal Not Present- Back Pain, Joint Pain, Joint Stiffness, Muscle Pain, Muscle Weakness and Swelling of Extremities. Neurological Not Present- Decreased Memory, Fainting, Headaches, Numbness, Seizures, Tingling, Tremor, Trouble walking and Weakness. Psychiatric Not Present- Anxiety, Bipolar, Change in Sleep Pattern, Depression, Fearful and Frequent crying. Endocrine Not Present- Cold Intolerance, Excessive Hunger, Hair Changes, Heat Intolerance, Hot flashes and New Diabetes. Hematology Not Present- Easy Bruising, Excessive bleeding, Gland problems, HIV and Persistent Infections.  Vitals (Clifford Eversole LPN; D34-534 075-GRM PM) 11/19/2015 2:08 PM Weight: 175.4 lb  Height: 71.5in Body Surface Area: 2 m Body Mass Index: 24.12 kg/m  Temp.: 98.83F(Oral)  Pulse: 74 (Regular)  BP: 142/86 (Sitting, Left Arm, Standard)      Physical Exam [name]; 11/19/2015 2:22 PM)  The physical exam findings are as follows: Note:WDWN in NAD HEENT: EOMI, sclera  anicteric Neck: No masses, no thyromegaly Lungs: CTA bilaterally; normal respiratory effort CV: Regular rate and rhythm; no murmurs Abd: +bowel sounds, soft, non-tender, small asymptomatic umbilical hernia GU: bilateral descended testes; right hydrocele Large right inguinal hernia - reducible when supine No sign of left inguinal hernia Ext: Well-perfused; no edema Skin: Warm, dry; no sign of jaundice    Assessment & Plan [name]; 11/19/2015 2:23 PM)  INGUINAL HERNIA OF RIGHT SIDE WITHOUT OBSTRUCTION OR GANGRENE (K40.90)  Current Plans Schedule for Surgery - Right inguinal hernia repair with mesh. The surgical procedure has been discussed with the patient. Potential risks, benefits, alternative treatments, and expected outcomes have been explained. All of the patient's questions at this time have been answered. The likelihood of reaching the patient's treatment goal is good. The patient understand the proposed surgical procedure and wishes to proceed. HYDROCELE, RIGHT (123456)  UMBILICAL HERNIA ([id number])  Note:We will drain the hydrocele at the time of surgery. He does not want to have any surgery performed on his umbilical hernia at this time.  [name]. Clifford Dover, Stuart, [facility]  General/ Trauma Surgery  11/19/2015 2:23 PM

## 2015-11-25 NOTE — Pre-Procedure Instructions (Signed)
     [name]  11/25/2015      Your procedure is scheduled on Wednesday, January 25,  Report to [facility] Admitting at : 8:30 A.M.                  Your surgery or procedure is scheduled for 10:30 AM   Call this number if you have problems the morning of surgery: [phone]   Remember:  Do not eat food or drink liquids after midnight.  Take these medicines the morning of surgery with A SIP OF WATER : Amlodipine, Pravastin, Tamsulosin.   Do not wear jewelry, make-up or nail polish.  Do not wear lotions, powders, or perfumes.     Men may shave face and neck.  Do not bring valuables to the hospital.  [facility] is not responsible for any belongings or valuables.  Contacts, dentures or bridgework may not be worn into surgery.  Leave your suitcase in the car.  After surgery it may be brought to your room.  For patients admitted to the hospital, discharge time will be determined by your treatment team.  Patients discharged the day of surgery will not be allowed to drive home.   Name and phone number of your driver:   -  Special instructions: Review  [facility] - Preparing For Surgery.  Please read over the following fact sheets that you were given. Pain Booklet, Coughing and Deep Breathing and Surgical Site Infection Prevention

## 2015-11-26 ENCOUNTER — Encounter (HOSPITAL_COMMUNITY)
Admission: RE | Admit: 2015-11-26 | Discharge: 2015-11-26 | Disposition: A | Discharge status: Home / Self Care | Payer: Medicare Other | Source: Ambulatory Visit | Attending: Surgery | Admitting: Surgery

## 2015-11-26 ENCOUNTER — Encounter (HOSPITAL_COMMUNITY): Payer: Self-pay

## 2015-11-26 DIAGNOSIS — M1990 Unspecified osteoarthritis, unspecified site: Secondary | ICD-10-CM | POA: Diagnosis not present

## 2015-11-26 DIAGNOSIS — K4090 Unilateral inguinal hernia, without obstruction or gangrene, not specified as recurrent: Secondary | ICD-10-CM | POA: Diagnosis not present

## 2015-11-26 DIAGNOSIS — Z79899 Other long term (current) drug therapy: Secondary | ICD-10-CM | POA: Diagnosis not present

## 2015-11-26 DIAGNOSIS — K429 Umbilical hernia without obstruction or gangrene: Secondary | ICD-10-CM | POA: Diagnosis not present

## 2015-11-26 DIAGNOSIS — K219 Gastro-esophageal reflux disease without esophagitis: Secondary | ICD-10-CM | POA: Diagnosis not present

## 2015-11-26 DIAGNOSIS — E7800 Pure hypercholesterolemia, unspecified: Secondary | ICD-10-CM | POA: Diagnosis not present

## 2015-11-26 DIAGNOSIS — N433 Hydrocele, unspecified: Secondary | ICD-10-CM | POA: Diagnosis not present

## 2015-11-26 DIAGNOSIS — Z87891 Personal history of nicotine dependence: Secondary | ICD-10-CM | POA: Diagnosis not present

## 2015-11-26 DIAGNOSIS — Z8546 Personal history of malignant neoplasm of prostate: Secondary | ICD-10-CM | POA: Diagnosis not present

## 2015-11-26 DIAGNOSIS — I10 Essential (primary) hypertension: Secondary | ICD-10-CM | POA: Diagnosis not present

## 2015-11-26 HISTORY — DX: Unspecified hearing loss, unspecified ear: H91.90

## 2015-11-26 LAB — BASIC METABOLIC PANEL
ANION GAP: 7 (ref 5–15)
BUN: 14 mg/dL (ref 6–20)
CALCIUM: 9.3 mg/dL (ref 8.9–10.3)
CHLORIDE: 107 mmol/L (ref 101–111)
CO2: 28 mmol/L (ref 22–32)
Creatinine, Ser: 1.14 mg/dL (ref 0.61–1.24)
GFR calc non Af Amer: 60 mL/min — ABNORMAL LOW (ref >60)
Glucose, Bld: 98 mg/dL (ref 65–99)
POTASSIUM: 4.1 mmol/L (ref 3.5–5.1)
Sodium: 142 mmol/L (ref 135–145)

## 2015-11-26 LAB — CBC
HEMATOCRIT: 38.5 % — AB (ref 39.0–52.0)
HEMOGLOBIN: 12.8 g/dL — AB (ref 13.0–17.0)
MCH: 31.2 pg (ref 26.0–34.0)
MCHC: 33.2 g/dL (ref 30.0–36.0)
MCV: 93.9 fL (ref 78.0–100.0)
Platelets: 220 10*3/uL (ref 150–400)
RBC: 4.1 MIL/uL — AB (ref 4.22–5.81)
RDW: 12.3 % (ref 11.5–15.5)
WBC: 4.8 10*3/uL (ref 4.0–10.5)

## 2015-11-26 LAB — SURGICAL PCR SCREEN
MRSA, PCR: NEGATIVE
Staphylococcus aureus: NEGATIVE

## 2015-11-26 MED ORDER — VANCOMYCIN HCL IN DEXTROSE 1-5 GM/200ML-% IV SOLN
1000.0000 mg | INTRAVENOUS | Status: AC
  Administered 2015-11-27: 1000 mg via INTRAVENOUS
  Filled 2015-11-26: qty 200

## 2015-11-26 MED ORDER — CHLORHEXIDINE GLUCONATE 4 % EX LIQD: 1.0000 "application " | Freq: Once | CUTANEOUS | Status: DC

## 2015-11-26 NOTE — Progress Notes (Signed)
[name] denies shortness of breath or chest pain.  Patient reports that he walks an hour a day and tries to eat healthy.

## 2015-11-27 ENCOUNTER — Ambulatory Visit (HOSPITAL_COMMUNITY)
Admission: RE | Admit: 2015-11-27 | Discharge: 2015-11-27 | Disposition: A | Discharge status: Home / Self Care | Payer: Medicare Other | Source: Ambulatory Visit | Attending: Surgery | Admitting: Surgery

## 2015-11-27 ENCOUNTER — Ambulatory Visit (HOSPITAL_COMMUNITY): Payer: Medicare Other | Admitting: Anesthesiology

## 2015-11-27 ENCOUNTER — Encounter (HOSPITAL_COMMUNITY): Payer: Self-pay | Admitting: Surgery

## 2015-11-27 ENCOUNTER — Encounter (HOSPITAL_COMMUNITY)
Admission: RE | Disposition: A | Discharge status: Home / Self Care | Payer: Self-pay | Source: Ambulatory Visit | Attending: Surgery

## 2015-11-27 DIAGNOSIS — Z79899 Other long term (current) drug therapy: Secondary | ICD-10-CM | POA: Diagnosis not present

## 2015-11-27 DIAGNOSIS — K219 Gastro-esophageal reflux disease without esophagitis: Secondary | ICD-10-CM | POA: Diagnosis not present

## 2015-11-27 DIAGNOSIS — E7800 Pure hypercholesterolemia, unspecified: Secondary | ICD-10-CM | POA: Diagnosis not present

## 2015-11-27 DIAGNOSIS — K429 Umbilical hernia without obstruction or gangrene: Secondary | ICD-10-CM | POA: Diagnosis not present

## 2015-11-27 DIAGNOSIS — I10 Essential (primary) hypertension: Secondary | ICD-10-CM | POA: Insufficient documentation

## 2015-11-27 DIAGNOSIS — Z87891 Personal history of nicotine dependence: Secondary | ICD-10-CM | POA: Diagnosis not present

## 2015-11-27 DIAGNOSIS — N433 Hydrocele, unspecified: Secondary | ICD-10-CM | POA: Insufficient documentation

## 2015-11-27 DIAGNOSIS — K4090 Unilateral inguinal hernia, without obstruction or gangrene, not specified as recurrent: Secondary | ICD-10-CM | POA: Diagnosis not present

## 2015-11-27 DIAGNOSIS — Z8546 Personal history of malignant neoplasm of prostate: Secondary | ICD-10-CM | POA: Insufficient documentation

## 2015-11-27 DIAGNOSIS — G8918 Other acute postprocedural pain: Secondary | ICD-10-CM | POA: Diagnosis not present

## 2015-11-27 DIAGNOSIS — M1990 Unspecified osteoarthritis, unspecified site: Secondary | ICD-10-CM | POA: Diagnosis not present

## 2015-11-27 HISTORY — PX: INGUINAL HERNIA REPAIR: SHX194

## 2015-11-27 HISTORY — PX: INSERTION OF MESH: SHX5868

## 2015-11-27 SURGERY — REPAIR, HERNIA, INGUINAL, ADULT
Anesthesia: General | Site: Groin | Laterality: Right

## 2015-11-27 MED ORDER — PROPOFOL 10 MG/ML IV BOLUS
INTRAVENOUS | Status: AC
  Filled 2015-11-27: qty 40

## 2015-11-27 MED ORDER — MEPERIDINE HCL 25 MG/ML IJ SOLN: 6.2500 mg | INTRAMUSCULAR | Status: DC | PRN

## 2015-11-27 MED ORDER — DEXAMETHASONE SODIUM PHOSPHATE 10 MG/ML IJ SOLN
INTRAMUSCULAR | Status: DC | PRN
  Administered 2015-11-27: 10 mg via INTRAVENOUS

## 2015-11-27 MED ORDER — LIDOCAINE HCL (CARDIAC) 20 MG/ML IV SOLN
INTRAVENOUS | Status: DC | PRN
  Administered 2015-11-27: 80 mg via INTRAVENOUS

## 2015-11-27 MED ORDER — SUCCINYLCHOLINE CHLORIDE 20 MG/ML IJ SOLN
INTRAMUSCULAR | Status: DC | PRN
  Administered 2015-11-27: 100 mg via INTRAVENOUS

## 2015-11-27 MED ORDER — BUPIVACAINE-EPINEPHRINE (PF) 0.25% -1:200000 IJ SOLN
INTRAMUSCULAR | Status: AC
  Filled 2015-11-27: qty 30

## 2015-11-27 MED ORDER — SUCCINYLCHOLINE CHLORIDE 20 MG/ML IJ SOLN
INTRAMUSCULAR | Status: AC
  Filled 2015-11-27: qty 1

## 2015-11-27 MED ORDER — FENTANYL CITRATE (PF) 100 MCG/2ML IJ SOLN
50.0000 ug | Freq: Once | INTRAMUSCULAR | Status: AC
  Administered 2015-11-27: 50 ug via INTRAVENOUS

## 2015-11-27 MED ORDER — FENTANYL CITRATE (PF) 100 MCG/2ML IJ SOLN
INTRAMUSCULAR | Status: DC | PRN
  Administered 2015-11-27 (×2): 50 ug via INTRAVENOUS

## 2015-11-27 MED ORDER — BUPIVACAINE-EPINEPHRINE 0.25% -1:200000 IJ SOLN
INTRAMUSCULAR | Status: DC | PRN
  Administered 2015-11-27: 10 mL

## 2015-11-27 MED ORDER — ONDANSETRON HCL 4 MG/2ML IJ SOLN
INTRAMUSCULAR | Status: AC
  Filled 2015-11-27: qty 2

## 2015-11-27 MED ORDER — BUPIVACAINE-EPINEPHRINE (PF) 0.5% -1:200000 IJ SOLN
INTRAMUSCULAR | Status: DC | PRN
  Administered 2015-11-27: 30 mL via PERINEURAL

## 2015-11-27 MED ORDER — PROMETHAZINE HCL 25 MG/ML IJ SOLN: 6.2500 mg | INTRAMUSCULAR | Status: DC | PRN

## 2015-11-27 MED ORDER — EPHEDRINE SULFATE 50 MG/ML IJ SOLN
INTRAMUSCULAR | Status: DC | PRN
  Administered 2015-11-27: 10 mg via INTRAVENOUS

## 2015-11-27 MED ORDER — OXYCODONE-ACETAMINOPHEN 5-325 MG PO TABS: 1.0000 | ORAL_TABLET | ORAL | Status: DC | PRN

## 2015-11-27 MED ORDER — ONDANSETRON HCL 4 MG/2ML IJ SOLN: 4.0000 mg | INTRAMUSCULAR | Status: DC | PRN

## 2015-11-27 MED ORDER — PROPOFOL 10 MG/ML IV BOLUS
INTRAVENOUS | Status: DC | PRN
  Administered 2015-11-27: 50 mg via INTRAVENOUS
  Administered 2015-11-27: 150 mg via INTRAVENOUS

## 2015-11-27 MED ORDER — MORPHINE SULFATE (PF) 2 MG/ML IV SOLN: 2.0000 mg | INTRAVENOUS | Status: DC | PRN

## 2015-11-27 MED ORDER — MIDAZOLAM HCL 2 MG/2ML IJ SOLN: 1.0000 mg | Freq: Once | INTRAMUSCULAR | Status: DC

## 2015-11-27 MED ORDER — FENTANYL CITRATE (PF) 100 MCG/2ML IJ SOLN
25.0000 ug | INTRAMUSCULAR | Status: DC | PRN
  Administered 2015-11-27 (×2): 25 ug via INTRAVENOUS

## 2015-11-27 MED ORDER — FENTANYL CITRATE (PF) 100 MCG/2ML IJ SOLN
INTRAMUSCULAR | Status: AC
  Administered 2015-11-27: 50 ug via INTRAVENOUS
  Filled 2015-11-27: qty 2

## 2015-11-27 MED ORDER — ONDANSETRON HCL 4 MG/2ML IJ SOLN
INTRAMUSCULAR | Status: DC | PRN
  Administered 2015-11-27: 4 mg via INTRAVENOUS

## 2015-11-27 MED ORDER — FENTANYL CITRATE (PF) 250 MCG/5ML IJ SOLN
INTRAMUSCULAR | Status: AC
  Filled 2015-11-27: qty 5

## 2015-11-27 MED ORDER — LACTATED RINGERS IV SOLN
INTRAVENOUS | Status: DC
  Administered 2015-11-27 (×3): via INTRAVENOUS

## 2015-11-27 MED ORDER — 0.9 % SODIUM CHLORIDE (POUR BTL) OPTIME
TOPICAL | Status: DC | PRN
  Administered 2015-11-27: 1000 mL

## 2015-11-27 MED ORDER — LIDOCAINE HCL (CARDIAC) 20 MG/ML IV SOLN
INTRAVENOUS | Status: AC
  Filled 2015-11-27: qty 5

## 2015-11-27 MED ORDER — MIDAZOLAM HCL 2 MG/2ML IJ SOLN
INTRAMUSCULAR | Status: AC
  Administered 2015-11-27: 1 mg
  Filled 2015-11-27: qty 2

## 2015-11-27 MED ORDER — HYDROCODONE-ACETAMINOPHEN 7.5-325 MG PO TABS: 1.0000 | ORAL_TABLET | Freq: Once | ORAL | Status: DC | PRN

## 2015-11-27 MED ORDER — FENTANYL CITRATE (PF) 100 MCG/2ML IJ SOLN
INTRAMUSCULAR | Status: AC
  Filled 2015-11-27: qty 2

## 2015-11-27 SURGICAL SUPPLY — 49 items
APL SKNCLS STERI-STRIP NONHPOA (GAUZE/BANDAGES/DRESSINGS) ×1
BENZOIN TINCTURE PRP APPL 2/3 (GAUZE/BANDAGES/DRESSINGS) ×3 IMPLANT
BLADE SURG 15 STRL LF DISP TIS (BLADE) ×1 IMPLANT
BLADE SURG 15 STRL SS (BLADE) ×3
BLADE SURG ROTATE 9660 (MISCELLANEOUS) ×2 IMPLANT
CHLORAPREP W/TINT 26ML (MISCELLANEOUS) ×3 IMPLANT
CLOSURE WOUND 1/2 X4 (GAUZE/BANDAGES/DRESSINGS) ×1
COVER SURGICAL LIGHT HANDLE (MISCELLANEOUS) ×3 IMPLANT
DRAIN PENROSE 1/2X12 LTX STRL (WOUND CARE) ×2 IMPLANT
DRAPE LAPAROSCOPIC ABDOMINAL (DRAPES) IMPLANT
DRAPE LAPAROTOMY TRNSV 102X78 (DRAPE) ×2 IMPLANT
DRAPE UTILITY XL STRL (DRAPES) ×4 IMPLANT
DRSG TEGADERM 4X4.75 (GAUZE/BANDAGES/DRESSINGS) ×3 IMPLANT
ELECT CAUTERY BLADE 6.4 (BLADE) ×3 IMPLANT
ELECT REM PT RETURN 9FT ADLT (ELECTROSURGICAL) ×3
ELECTRODE REM PT RTRN 9FT ADLT (ELECTROSURGICAL) ×1 IMPLANT
GAUZE SPONGE 4X4 12PLY STRL (GAUZE/BANDAGES/DRESSINGS) ×3 IMPLANT
GAUZE SPONGE 4X4 16PLY XRAY LF (GAUZE/BANDAGES/DRESSINGS) ×3 IMPLANT
GLOVE BIO SURGEON STRL SZ7 (GLOVE) ×5 IMPLANT
GLOVE BIOGEL PI IND STRL 7.0 (GLOVE) IMPLANT
GLOVE BIOGEL PI IND STRL 7.5 (GLOVE) ×1 IMPLANT
GLOVE BIOGEL PI INDICATOR 7.0 (GLOVE) ×4
GLOVE BIOGEL PI INDICATOR 7.5 (GLOVE) ×2
GLOVE SURG SS PI 6.5 STRL IVOR (GLOVE) ×2 IMPLANT
GOWN STRL REUS W/ TWL LRG LVL3 (GOWN DISPOSABLE) ×2 IMPLANT
GOWN STRL REUS W/TWL LRG LVL3 (GOWN DISPOSABLE) ×6
KIT BASIN OR (CUSTOM PROCEDURE TRAY) ×3 IMPLANT
KIT ROOM TURNOVER OR (KITS) ×3 IMPLANT
MESH PARIETEX PROGRIP RIGHT (Mesh General) ×2 IMPLANT
NDL HYPO 25GX1X1/2 BEV (NEEDLE) ×1 IMPLANT
NEEDLE HYPO 25GX1X1/2 BEV (NEEDLE) ×3 IMPLANT
NS IRRIG 1000ML POUR BTL (IV SOLUTION) ×3 IMPLANT
PACK SURGICAL SETUP 50X90 (CUSTOM PROCEDURE TRAY) ×3 IMPLANT
PAD ARMBOARD 7.5X6 YLW CONV (MISCELLANEOUS) ×3 IMPLANT
PENCIL BUTTON HOLSTER BLD 10FT (ELECTRODE) ×3 IMPLANT
SPONGE INTESTINAL PEANUT (DISPOSABLE) ×3 IMPLANT
STRIP CLOSURE SKIN 1/2X4 (GAUZE/BANDAGES/DRESSINGS) ×2 IMPLANT
SUT MNCRL AB 4-0 PS2 18 (SUTURE) ×3 IMPLANT
SUT PDS AB 0 CT 36 (SUTURE) IMPLANT
SUT SILK 2 0 SH (SUTURE) IMPLANT
SUT SILK 3 0 (SUTURE)
SUT SILK 3-0 18XBRD TIE 12 (SUTURE) IMPLANT
SUT VIC AB 0 CT2 27 (SUTURE) ×3 IMPLANT
SUT VIC AB 2-0 SH 27 (SUTURE) ×3
SUT VIC AB 2-0 SH 27X BRD (SUTURE) ×1 IMPLANT
SUT VIC AB 3-0 SH 27 (SUTURE) ×3
SUT VIC AB 3-0 SH 27XBRD (SUTURE) ×1 IMPLANT
SYR CONTROL 10ML LL (SYRINGE) ×3 IMPLANT
TOWEL OR 17X24 6PK STRL BLUE (TOWEL DISPOSABLE) ×3 IMPLANT

## 2015-11-27 NOTE — Transfer of Care (Signed)
Immediate Anesthesia Transfer of Care Note  Patient: [name]  Procedure(s) Performed: Procedure(s): RIGHT HERNIA REPAIR INGUINAL ADULT WITH MESH (Right) INSERTION OF MESH (Right)  Patient Location: PACU  Anesthesia Type:General and Regional  Level of Consciousness: awake, alert , oriented and sedated  Airway & Oxygen Therapy: Patient Spontanous Breathing and Patient connected to nasal cannula oxygen  Post-op Assessment: Report given to RN, Post -op Vital signs reviewed and stable and Patient moving all extremities  Post vital signs: Reviewed and stable  Last Vitals:  Filed Vitals:   11/27/15 0815 11/27/15 0925  BP: 160/77 160/81  Pulse: 63 70  Temp: 36.7 C   Resp: 20 20    Complications: No apparent anesthesia complications

## 2015-11-27 NOTE — Op Note (Signed)
Hernia, Open, Procedure Note  Indications: The patient presented with a history of a right, reducible inguinal hernia as well as a right hydrocele.    Pre-operative Diagnosis: right reducible inguinal hernia and hydrocele Post-operative Diagnosis: same  Surgeon: Maia Petties.   Assistants: none  Anesthesia: General endotracheal anesthesia and TAP block  ASA Class: 2  Procedure Details  The patient was seen again in the Holding Room. The risks, benefits, complications, treatment options, and expected outcomes were discussed with the patient. The possibilities of reaction to medication, pulmonary aspiration, perforation of viscus, bleeding, recurrent infection, the need for additional procedures, and development of a complication requiring transfusion or further operation were discussed with the patient and/or family. The likelihood of success in repairing the hernia and returning the patient to their previous functional status is good.  There was concurrence with the proposed plan, and informed consent was obtained. The site of surgery was properly noted/marked. The patient was taken to the Operating Room, identified as [name], and the procedure verified as right inguinal hernia repair. A Time Out was held and the above information confirmed.  The patient was placed in the supine position and underwent induction of anesthesia. The lower abdomen and groin was prepped with Chloraprep and draped in the standard fashion, and 0.25% Marcaine with epinephrine was used to anesthetize the skin over the mid-portion of the inguinal canal. An oblique incision was made. Dissection was carried down through the subcutaneous tissue with cautery to the external oblique fascia.  We opened the external oblique fascia along the direction of its fibers to the external ring.  The spermatic cord was circumferentially dissected bluntly and retracted with a Penrose drain.  The ilioinguinal nerve was identified and  preserved.  The floor of the inguinal canal was inspected and was intact.  We skeletonized the spermatic cord and reduced a large indirect hernia sac.  A right hydrocele was opened and drained.  We excised part of the wall of the hydrocele.  The internal ring was tightened with 0 Vicryl.  We used a right-sided Progrip mesh which was inserted and deployed across the floor of the inguinal canal. The mesh was tucked underneath the external oblique fascia laterally.  The flap of the mesh was closed around the spermatic cord to recreate the internal inguinal ring.  The mesh was secured to the pubic tubercle with 0 Vicryl.  Additional sutures were used to secure the lower edge of the mesh to the shelving edge and to secure the flap of the mesh.  The external oblique fascia was reapproximated with 2-0 Vicryl.  3-0 Vicryl was used to close the subcutaneous tissues and 4-0 Monocryl was used to close the skin in subcuticular fashion.  Benzoin and steri-strips were used to seal the incision.  A clean dressing was applied.  The patient was then extubated and brought to the recovery room in stable condition.  All sponge, instrument, and needle counts were correct prior to closure and at the conclusion of the case.   Estimated Blood Loss: Minimal                 Complications: None; patient tolerated the procedure well.         Disposition: PACU - hemodynamically stable.         Condition: stable  Imogene Burn. Georgette Dover, MD, [facility] Surgery  General/ Trauma Surgery  11/27/2015 11:43 AM

## 2015-11-27 NOTE — Interval H&P Note (Signed)
History and Physical Interval Note:  11/27/2015 8:50 AM  [name]  has presented today for surgery, with the diagnosis of Right inguinal hernia  The various methods of treatment have been discussed with the patient and family. After consideration of risks, benefits and other options for treatment, the patient has consented to  Procedure(s): RIGHT HERNIA REPAIR INGUINAL ADULT WITH MESH (Right) INSERTION OF MESH (Right) as a surgical intervention .  The patient's history has been reviewed, patient examined, no change in status, stable for surgery.  I have reviewed the patient's chart and labs.  Questions were answered to the patient's satisfaction.     Udell Blasingame K.

## 2015-11-27 NOTE — Discharge Instructions (Signed)
[facility] ° ° INGUINAL HERNIA REPAIR: POST OP INSTRUCTIONS ° °Always review your discharge instruction sheet given to you by the facility where your surgery was performed. °IF YOU HAVE DISABILITY OR FAMILY LEAVE FORMS, YOU MUST BRING THEM TO THE OFFICE FOR PROCESSING.   °DO NOT GIVE THEM TO YOUR DOCTOR. ° °1. A  prescription for pain medication may be given to you upon discharge.  Take your pain medication as prescribed, if needed.  If narcotic pain medicine is not needed, then you may take acetaminophen (Tylenol) or ibuprofen (Advil) as needed. °2. Take your usually prescribed medications unless otherwise directed. °3. If you need a refill on your pain medication, please contact your pharmacy.  They will contact our office to request authorization. Prescriptions will not be filled after 5 pm or on week-ends. °4. You should follow a light diet the first 24 hours after arrival home, such as soup and crackers, etc.  Be sure to include lots of fluids daily.  Resume your normal diet the day after surgery. °5. Most patients will experience some swelling and bruising around the umbilicus or in the groin and scrotum.  Ice packs and reclining will help.  Swelling and bruising can take several days to resolve.  °6. It is common to experience some constipation if taking pain medication after surgery.  Increasing fluid intake and taking a stool softener (such as Colace) will usually help or prevent this problem from occurring.  A mild laxative (Milk of Magnesia or Miralax) should be taken according to package directions if there are no bowel movements after 48 hours. °7. Unless discharge instructions indicate otherwise, you may remove your bandages 24-48 hours after surgery, and you may shower at that time.  You will have steri-strips (small skin tapes) in place directly over the incision.  These strips should be left on the skin for 7-10 days. °8. ACTIVITIES:  You may resume regular (light) daily activities  beginning the next day--such as daily self-care, walking, climbing stairs--gradually increasing activities as tolerated.  You may have sexual intercourse when it is comfortable.  Refrain from any heavy lifting or straining until approved by your doctor. °a. You may drive when you are no longer taking prescription pain medication, you can comfortably wear a seatbelt, and you can safely maneuver your car and apply brakes. °b. RETURN TO WORK:  2-3 weeks with light duty - no lifting over 15 lbs. °9. You should see your doctor in the office for a follow-up appointment approximately 2-3 weeks after your surgery.  Make sure that you call for this appointment within a day or two after you arrive home to insure a convenient appointment time. °10. OTHER INSTRUCTIONS:  __________________________________________________________________________________________________________________________________________________________________________________________  °WHEN TO CALL YOUR DOCTOR: °1. Fever over 101.0 °2. Inability to urinate °3. Nausea and/or vomiting °4. Extreme swelling or bruising °5. Continued bleeding from incision. °6. Increased pain, redness, or drainage from the incision ° °The clinic staff is available to answer your questions during regular business hours.  Please don’t hesitate to call and ask to speak to one of the nurses for clinical concerns.  If you have a medical emergency, go to the nearest emergency room or call 911.  A surgeon from [facility] is always on call at the hospital ° ° °[address], [address], [place], [place]  [address] ? ° [address] [address], [place], [place]   [address] °[phone]    [phone]    FAX [phone] °Web site:   [email or web address] ° ° °

## 2015-11-27 NOTE — Anesthesia Procedure Notes (Addendum)
Anesthesia Regional Block:  TAP block  Pre-Anesthetic Checklist: ,, timeout performed, Correct Patient, Correct Site, Correct Laterality, Correct Procedure, Correct Position, site marked, Risks and benefits discussed,  Surgical consent,  Pre-op evaluation,  At surgeon's request and post-op pain management  Laterality: Right  Prep: chloraprep       Needles:   Needle Type: Echogenic Stimulator Needle     Needle Length: 9cm 9 cm Needle Gauge: 22 and 22 G    Additional Needles:  Procedures: ultrasound guided (picture in chart) TAP block Narrative:  Injection made incrementally with aspirations every 5 mL.  Performed by: Personally  Anesthesiologist: Josephine Igo  Additional Notes: Anatomy identified on [place]. Needle visualization adequate. Perineural spread visualized on [place]. Patient tolerated procedure well.    Procedure Name: Intubation Date/Time: 11/27/2015 10:46 AM Performed by: Scheryl Darter Pre-anesthesia Checklist: Patient identified, Emergency Drugs available, Suction available, Patient being monitored and Timeout performed Patient Re-evaluated:Patient Re-evaluated prior to inductionOxygen Delivery Method: Circle system utilized Preoxygenation: Pre-oxygenation with 100% oxygen Ventilation: Mask ventilation without difficulty Laryngoscope Size: Mac and 4 Grade View: Grade I Tube type: Oral Tube size: 7.5 mm Number of attempts: 1 Airway Equipment and Method: Stylet Placement Confirmation: ETT inserted through vocal cords under direct vision,  positive ETCO2 and breath sounds checked- equal and bilateral Secured at: 23 cm Tube secured with: Tape Dental Injury: Teeth and Oropharynx as per pre-operative assessment

## 2015-11-27 NOTE — H&P (View-Only) (Signed)
History of Present Illness [name]. Brandilynn Taormina MD; 11/19/2015 2:21 PM) Patient words: hernia.  The patient is a 81 year old male who presents with an inguinal hernia. Referred by Dr. Oval Linsey for evaluation of right inguinal hernia and right hydrocele This is a 81 year old male who is 4 years status post left inguinal hernia repair with mesh for a large direct inguinal hernia. The patient is doing well from that standpoint. A couple of years ago, he began noticing a bulge in his right groin. This has become fairly large. This becomes uncomfortable extending down into the scrotum. He was recently evaluated with an ultrasound of the scrotum which showed a large right inguinal hernia as well as a right hydrocele. The patient denies any obstructive symptoms. The hernia reduces and is more comfortable when he is supine.   Other Problems (Ammie Eversole, LPN; [id number] PM) Arthritis Bladder Problems High blood pressure Hypercholesterolemia Inguinal Hernia Umbilical Hernia Repair  Past Surgical History (Ammie Eversole, LPN; [id number] PM) Open Inguinal Hernia Surgery Left.  Allergies (Ammie Eversole, LPN; D34-534 075-GRM PM) Penicillins  Medication History (Ammie Eversole, LPN; D34-534 075-GRM PM) AmLODIPine Besylate (10MG  Tablet, Oral) Active. Ciclopirox (8% Solution, External) Active. Lisinopril (10MG  Tablet, Oral) Active. Pravastatin Sodium (20MG  Tablet, Oral) Active. Tamsulosin HCl (0.4MG  Capsule, Oral) Active. Medications Reconciled  Social History (Aleatha Borer, LPN; [id number] PM) Alcohol use Remotely quit alcohol use. Caffeine use Coffee. No drug use Tobacco use Former smoker.  Family History Aleatha Borer, LPN; [id number] PM) Arthritis Brother, Father, Mother, Sister. Colon Cancer Brother, Father, Mother, Sister. Hypertension Brother, Father, Mother, Sister. Prostate Cancer Brother, Father.     Review of Systems (Ammie  Eversole LPN; [id number] PM) General Not Present- Appetite Loss, Chills, Fatigue, Fever, Night Sweats, Weight Gain and Weight Loss. Skin Not Present- Change in Wart/Mole, Dryness, Hives, Jaundice, New Lesions, Non-Healing Wounds, Rash and Ulcer. HEENT Present- Hearing Loss and Wears glasses/contact lenses. Not Present- Earache, Hoarseness, Nose Bleed, Oral Ulcers, Ringing in the Ears, Seasonal Allergies, Sinus Pain, Sore Throat, Visual Disturbances and Yellow Eyes. Respiratory Not Present- Bloody sputum, Chronic Cough, Difficulty Breathing, Snoring and Wheezing. Breast Not Present- Breast Mass, Breast Pain, Nipple Discharge and Skin Changes. Cardiovascular Not Present- Chest Pain, Difficulty Breathing Lying Down, Leg Cramps, Palpitations, Rapid Heart Rate, Shortness of Breath and Swelling of Extremities. Gastrointestinal Present- Excessive gas. Not Present- Abdominal Pain, Bloating, Bloody Stool, Change in Bowel Habits, Chronic diarrhea, Constipation, Difficulty Swallowing, Gets full quickly at meals, Hemorrhoids, Indigestion, Nausea, Rectal Pain and Vomiting. Male Genitourinary Present- Frequency, Nocturia, Urgency and Urine Leakage. Not Present- Blood in Urine, Change in Urinary Stream, Impotence and Painful Urination. Musculoskeletal Not Present- Back Pain, Joint Pain, Joint Stiffness, Muscle Pain, Muscle Weakness and Swelling of Extremities. Neurological Not Present- Decreased Memory, Fainting, Headaches, Numbness, Seizures, Tingling, Tremor, Trouble walking and Weakness. Psychiatric Not Present- Anxiety, Bipolar, Change in Sleep Pattern, Depression, Fearful and Frequent crying. Endocrine Not Present- Cold Intolerance, Excessive Hunger, Hair Changes, Heat Intolerance, Hot flashes and New Diabetes. Hematology Not Present- Easy Bruising, Excessive bleeding, Gland problems, HIV and Persistent Infections.  Vitals (Ammie Eversole LPN; [id number] 075-GRM PM) 11/19/2015 2:08 PM Weight: 175.4 lb  Height: 71.5in Body Surface Area: 2 m Body Mass Index: 24.12 kg/m  Temp.: 98.44F(Oral)  Pulse: 74 (Regular)  BP: 142/86 (Sitting, Left Arm, Standard)      Physical Exam Rodman Key K. Briannie Gutierrez MD; 11/19/2015 2:22 PM)  The physical exam findings are as follows: Note:WDWN in NAD HEENT: EOMI, sclera  anicteric Neck: No masses, no thyromegaly Lungs: CTA bilaterally; normal respiratory effort CV: Regular rate and rhythm; no murmurs Abd: +bowel sounds, soft, non-tender, small asymptomatic umbilical hernia GU: bilateral descended testes; right hydrocele Large right inguinal hernia - reducible when supine No sign of left inguinal hernia Ext: Well-perfused; no edema Skin: Warm, dry; no sign of jaundice    Assessment & Plan Rodman Key K. Lilyanah Celestin MD; 11/19/2015 2:23 PM)  INGUINAL HERNIA OF RIGHT SIDE WITHOUT OBSTRUCTION OR GANGRENE (K40.90)  Current Plans Schedule for Surgery - Right inguinal hernia repair with mesh. The surgical procedure has been discussed with the patient. Potential risks, benefits, alternative treatments, and expected outcomes have been explained. All of the patient's questions at this time have been answered. The likelihood of reaching the patient's treatment goal is good. The patient understand the proposed surgical procedure and wishes to proceed. HYDROCELE, RIGHT ([id number])  UMBILICAL HERNIA ([id number])  Note:We will drain the hydrocele at the time of surgery. He does not want to have any surgery performed on his umbilical hernia at this time.  [name]. Georgette Dover, MD, [facility]  General/ Trauma Surgery  11/19/2015 2:23 PM

## 2015-11-27 NOTE — Anesthesia Postprocedure Evaluation (Signed)
Anesthesia Post Note  Patient: [name]  Procedure(s) Performed: Procedure(s) (LRB): RIGHT HERNIA REPAIR INGUINAL ADULT WITH MESH (Right) INSERTION OF MESH (Right)  Patient location during evaluation: PACU Anesthesia Type: General Level of consciousness: awake and alert Pain management: pain level controlled Respiratory status: spontaneous breathing, nonlabored ventilation, respiratory function stable and patient connected to nasal cannula oxygen Cardiovascular status: blood pressure returned to baseline and stable Postop Assessment: no signs of nausea or vomiting Anesthetic complications: no    Last Vitals:  Filed Vitals:   11/27/15 1220 11/27/15 1250  BP: 159/86 168/88  Pulse: 75 72  Temp:    Resp: 20 16    Last Pain:  Filed Vitals:   11/27/15 1251  PainSc: 0-No pain                 Evann Koelzer A.

## 2015-11-27 NOTE — Anesthesia Preprocedure Evaluation (Addendum)
Anesthesia Evaluation  Patient identified by MRN, date of birth, ID band Patient awake    Reviewed: Allergy & Precautions, NPO status , Patient's Chart, lab work & pertinent test results  Airway Mallampati: III  TM Distance: >3 FB Neck ROM: Full    Dental  (+) Edentulous Upper, Edentulous Lower   Pulmonary former smoker,  Solitary pulmonary nodule   Pulmonary exam normal breath sounds clear to auscultation       Cardiovascular hypertension, Pt. on medications Normal cardiovascular exam Rhythm:Regular Rate:Normal     Neuro/Psych negative psych ROS   GI/Hepatic Neg liver ROS, GERD  Medicated and Controlled,  Endo/Other  Hyperlipidemia  Renal/GU negative Renal ROS   Hx/o Prostate Ca S/P RT    Musculoskeletal  (+) Arthritis , Osteoarthritis,  Right inguinal hernia   Abdominal   Peds  Hematology negative hematology ROS (+)   Anesthesia Other Findings   Reproductive/Obstetrics                            Anesthesia Physical Anesthesia Plan  ASA: II  Anesthesia Plan: General   Post-op Pain Management: GA combined w/ Regional for post-op pain   Induction: Intravenous  Airway Management Planned: LMA  Additional Equipment:   Intra-op Plan:   Post-operative Plan: Extubation in OR  Informed Consent: I have reviewed the patients History and Physical, chart, labs and discussed the procedure including the risks, benefits and alternatives for the proposed anesthesia with the patient or authorized representative who has indicated his/her understanding and acceptance.   Dental advisory given  Plan Discussed with: CRNA, Anesthesiologist and Surgeon  Anesthesia Plan Comments:         Anesthesia Quick Evaluation

## 2015-11-28 ENCOUNTER — Encounter (HOSPITAL_COMMUNITY): Payer: Self-pay | Admitting: Surgery

## 2016-02-03 DIAGNOSIS — C61 Malignant neoplasm of prostate: Secondary | ICD-10-CM | POA: Diagnosis not present

## 2016-02-06 DIAGNOSIS — Z0000 Encounter for general adult medical examination without abnormal findings: Secondary | ICD-10-CM | POA: Diagnosis not present

## 2016-02-06 DIAGNOSIS — Z8546 Personal history of malignant neoplasm of prostate: Secondary | ICD-10-CM | POA: Diagnosis not present

## 2016-04-01 ENCOUNTER — Ambulatory Visit (INDEPENDENT_AMBULATORY_CARE_PROVIDER_SITE_OTHER): Payer: Medicare Other | Admitting: Internal Medicine

## 2016-04-01 VITALS — BP 132/69 | HR 58 | Temp 97.7°F | Ht 71.5 in | Wt 179.9 lb

## 2016-04-01 DIAGNOSIS — Z87828 Personal history of other (healed) physical injury and trauma: Secondary | ICD-10-CM

## 2016-04-01 DIAGNOSIS — H578 Other specified disorders of eye and adnexa: Secondary | ICD-10-CM

## 2016-04-01 DIAGNOSIS — E785 Hyperlipidemia, unspecified: Secondary | ICD-10-CM | POA: Diagnosis not present

## 2016-04-01 DIAGNOSIS — H1013 Acute atopic conjunctivitis, bilateral: Secondary | ICD-10-CM

## 2016-04-01 DIAGNOSIS — K4090 Unilateral inguinal hernia, without obstruction or gangrene, not specified as recurrent: Secondary | ICD-10-CM

## 2016-04-01 DIAGNOSIS — H1010 Acute atopic conjunctivitis, unspecified eye: Secondary | ICD-10-CM | POA: Insufficient documentation

## 2016-04-01 DIAGNOSIS — I10 Essential (primary) hypertension: Secondary | ICD-10-CM

## 2016-04-01 HISTORY — DX: Personal history of other (healed) physical injury and trauma: Z87.828

## 2016-04-01 MED ORDER — OLOPATADINE HCL 0.2 % OP SOLN: 1.0000 [drp] | Freq: Two times a day (BID) | OPHTHALMIC | Status: DC

## 2016-04-01 MED ORDER — AMLODIPINE BESYLATE 10 MG PO TABS: 10.0000 mg | ORAL_TABLET | Freq: Every day | ORAL | Status: DC

## 2016-04-01 NOTE — Progress Notes (Signed)
Medicine attending: Medical history, presenting problems, physical findings, and medications, reviewed with resident physician Dr Alexa Burns on the day of the patient visit and I concur with her evaluation and management plan. 

## 2016-04-01 NOTE — Progress Notes (Signed)
Subjective:    Patient ID: [name], male    DOB: [birth year], 81 y.o.   MRN: [id number]  HPI [name] is a 81 y.o. male with PMHx of HTN, prostate cancer, and HLD who presents to the clinic for follow up for HTN. Please see A&P for the status of the patient's chronic medical problems.   Past Medical History  Diagnosis Date  . Hypertension   . Hyperlipidemia   . Osteoarthrosis   . Hemorrhoids   . GERD (gastroesophageal reflux disease)   . Inguinal hernia     left  . HOH (hard of hearing)   . Cancer [facility])     Prostate surgery, 8 weeks of Radiation    Outpatient Encounter Prescriptions as of 04/01/2016  Medication Sig  . amLODipine (NORVASC) 10 MG tablet TAKE 1 TABLET BY MOUTH EVERY DAY  . lisinopril (PRINIVIL,ZESTRIL) 10 MG tablet Take 1 tablet (10 mg total) by mouth daily.  . Olopatadine HCl 0.2 % SOLN Apply 1 drop to eye 2 (two) times daily.  Marland Kitchen oxyCODONE-acetaminophen (PERCOCET/ROXICET) 5-325 MG tablet Take 1 tablet by mouth every 4 (four) hours as needed for severe pain.  . pravastatin (PRAVACHOL) 20 MG tablet Take 1 tablet (20 mg total) by mouth daily.  . tamsulosin (FLOMAX) 0.4 MG CAPS capsule Take 2 capsules (0.8 mg total) by mouth at bedtime. (Patient taking differently: Take by mouth at bedtime. Patient is not taking, does not work)   No facility-administered encounter medications on file as of 04/01/2016.    Family History  Problem Relation Age of Onset  . Cancer Mother   . Cancer Father     Social History   Social History  . Marital Status: Single    Spouse Name: N/A  . Number of Children: N/A  . Years of Education: N/A   Occupational History  . Not on file.   Social History Main Topics  . Smoking status: Former Smoker    Types: Cigarettes  . Smokeless tobacco: Not on file     Comment: quit in early 1960's  . Alcohol Use: No  . Drug Use: No  . Sexual Activity: Not on file   Other Topics Concern  . Not on file   Social History Narrative   Retired   Single   Former Smoker   Alcohol use- no   Drug use- no    Review of Systems General: Denies fever, chills, fatigue, change in appetite.  HEENT: Admits to itchy eyes. Denies nasal congestion or sore throat.  Respiratory: Denies SOB, cough, DOE.   Cardiovascular: Denies chest pain and palpitations.  Gastrointestinal: Denies nausea, vomiting, abdominal pain, diarrhea, constipation Genitourinary: Admits to urinary hesitancy. Denies dysuria, urgency, hematuria. Denies groin pain or swelling. Skin: Denies rash and wounds.  Neurological: Denies dizziness, headaches, weakness, lightheadedness    Objective:   Physical Exam Filed Vitals:   04/01/16 1450  BP: 132/69  Pulse: 58  Temp: 97.7 F (36.5 C)  TempSrc: Oral  Height: 5' 11.5" (1.816 m)  Weight: 179 lb 14.4 oz (81.602 kg)  SpO2: 100%   General: Vital signs reviewed.  Patient is well-developed and well-nourished, in no acute distress and cooperative with exam.  HEENT: Normocephalic and atraumatic.Eyes- EOMI, conjunctivae normal, no scleral icterus, no conjunctival injection. Normal tympanic membranes without excessive wax.  Cardiovascular: RRR, S1 normal, S2 normal, no murmurs, gallops, or rubs. Pulmonary/Chest: Clear to auscultation bilaterally, no wheezes, rales, or rhonchi. Abdominal: Soft, non-tender, non-distended, BS + Extremities: No lower extremity  edema bilaterally Skin: Warm, dry and intact.  Psychiatric: Normal mood and affect. speech and behavior is normal. Cognition and memory are grossly normal.     Assessment & Plan:   Please see problem based assessment and plan.

## 2016-04-01 NOTE — Patient Instructions (Signed)
Use the eye drops 1 to 2 times per day for relief of symptoms.   Continue all other medications the same.  Follow up in one year.

## 2016-04-01 NOTE — Assessment & Plan Note (Signed)
Cholesterol has been well controlled on pravastatin 20 mg daily. Last lipid panel was in 2015.   Plan: -Continue pravastatin 20 mg daily -Repeat lipid panel today and adjust pravastatin as needed

## 2016-04-01 NOTE — Assessment & Plan Note (Addendum)
Patient was previously evaluated for right groin pain and found to have large right sided reducible inguinal hernia with a large hydrocele. Patient underwent mesh repair in January 2017. He denies any symptoms of pain or swelling.

## 2016-04-01 NOTE — Assessment & Plan Note (Signed)
BP Readings from Last 3 Encounters:  04/01/16 132/69  11/27/15 164/84  11/26/15 112/61    Lab Results  Component Value Date   NA 142 11/26/2015   K 4.1 11/26/2015   CREATININE 1.14 11/26/2015    Assessment: Blood pressure control:  Well controlled Progress toward BP goal:   Stable Comments: Compliant with amlodipine 10 mg daily, lisinopril 10 mg daily and tamsulosin 0.8 mg QHS.  Plan: Medications:  continue current medications Educational resources provided: brochure (denies ) Self management tools provided:   Other plans: Follow up in one year
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
## 2016-04-01 NOTE — Assessment & Plan Note (Signed)
Patient complains of red, itchy eyes over the last couple of months which he contributes to allergies. He denies any thick drainage from the eye, changes in vision, fever, nasal congestion or sore throat. He has a history of allergic rhinitis. Conjunctiva appear normal on exam.  Plan: -Pataday eye drop solution 1 drop BID each eye

## 2016-04-02 LAB — LIPID PANEL
CHOL/HDL RATIO: 2.6 ratio (ref 0.0–5.0)
Cholesterol, Total: 220 mg/dL — ABNORMAL HIGH (ref 100–199)
HDL: 85 mg/dL (ref >39)
LDL CALC: 117 mg/dL — AB (ref 0–99)
TRIGLYCERIDES: 88 mg/dL (ref 0–149)
VLDL Cholesterol Cal: 18 mg/dL (ref 5–40)

## 2016-06-18 ENCOUNTER — Encounter: Payer: Self-pay | Admitting: Internal Medicine

## 2016-07-07 ENCOUNTER — Other Ambulatory Visit: Payer: Self-pay | Admitting: Internal Medicine

## 2016-07-07 DIAGNOSIS — I10 Essential (primary) hypertension: Secondary | ICD-10-CM

## 2016-08-03 ENCOUNTER — Other Ambulatory Visit: Payer: Self-pay | Admitting: Internal Medicine

## 2016-08-10 ENCOUNTER — Other Ambulatory Visit: Payer: Self-pay | Admitting: Internal Medicine

## 2016-08-10 DIAGNOSIS — I10 Essential (primary) hypertension: Secondary | ICD-10-CM

## 2016-08-31 ENCOUNTER — Other Ambulatory Visit: Payer: Self-pay | Admitting: Internal Medicine

## 2017-02-09 DIAGNOSIS — N3281 Overactive bladder: Secondary | ICD-10-CM | POA: Diagnosis not present

## 2017-02-10 ENCOUNTER — Other Ambulatory Visit: Payer: Self-pay | Admitting: Oncology

## 2017-02-10 DIAGNOSIS — I10 Essential (primary) hypertension: Secondary | ICD-10-CM

## 2017-03-31 ENCOUNTER — Ambulatory Visit (INDEPENDENT_AMBULATORY_CARE_PROVIDER_SITE_OTHER): Payer: Medicare Other | Admitting: Internal Medicine

## 2017-03-31 ENCOUNTER — Encounter: Payer: Self-pay | Admitting: Internal Medicine

## 2017-03-31 VITALS — BP 122/60 | HR 58 | Temp 97.8°F | Ht 71.5 in | Wt 175.4 lb

## 2017-03-31 DIAGNOSIS — Z923 Personal history of irradiation: Secondary | ICD-10-CM | POA: Diagnosis not present

## 2017-03-31 DIAGNOSIS — M25561 Pain in right knee: Secondary | ICD-10-CM | POA: Diagnosis not present

## 2017-03-31 DIAGNOSIS — T148XXA Other injury of unspecified body region, initial encounter: Secondary | ICD-10-CM

## 2017-03-31 DIAGNOSIS — S29012A Strain of muscle and tendon of back wall of thorax, initial encounter: Secondary | ICD-10-CM | POA: Diagnosis not present

## 2017-03-31 DIAGNOSIS — R8299 Other abnormal findings in urine: Secondary | ICD-10-CM

## 2017-03-31 DIAGNOSIS — R001 Bradycardia, unspecified: Secondary | ICD-10-CM | POA: Diagnosis not present

## 2017-03-31 DIAGNOSIS — Z8546 Personal history of malignant neoplasm of prostate: Secondary | ICD-10-CM | POA: Diagnosis not present

## 2017-03-31 DIAGNOSIS — Z79899 Other long term (current) drug therapy: Secondary | ICD-10-CM

## 2017-03-31 DIAGNOSIS — I10 Essential (primary) hypertension: Secondary | ICD-10-CM | POA: Diagnosis not present

## 2017-03-31 DIAGNOSIS — Z87891 Personal history of nicotine dependence: Secondary | ICD-10-CM

## 2017-03-31 DIAGNOSIS — X58XXXA Exposure to other specified factors, initial encounter: Secondary | ICD-10-CM | POA: Diagnosis not present

## 2017-03-31 DIAGNOSIS — G8929 Other chronic pain: Secondary | ICD-10-CM

## 2017-03-31 DIAGNOSIS — E785 Hyperlipidemia, unspecified: Secondary | ICD-10-CM

## 2017-03-31 DIAGNOSIS — R82998 Other abnormal findings in urine: Secondary | ICD-10-CM

## 2017-03-31 DIAGNOSIS — M1711 Unilateral primary osteoarthritis, right knee: Secondary | ICD-10-CM

## 2017-03-31 MED ORDER — DICLOFENAC SODIUM 1 % TD GEL: 2.0000 g | Freq: Four times a day (QID) | TRANSDERMAL | 3 refills | Status: DC

## 2017-03-31 NOTE — Progress Notes (Signed)
    CC: Follow-up for hypertension  HPI: [name] is a 82 y.o. male with PMHx of hypertension, hyperlipidemia, history of prostate cancer who presents to the clinic for follow-up for hypertension.   Patient reports that overall he has been feeling well he denies any change in weight. He has been eating and drinking well and taking all medications without difficulty.  Patient does report a left posterior upper back pain for the past 3 weeks. This is a daily pain that comes and goes. It is worse with movement and palpation. He denies any falls, injuries, or new activity. He states that he is very active with exercise and does many "twisting"motions. He denies any worsening of the pain with inspiration. He denies any associated fever, chills, chest pain, shortness of breath, cough, dysuria, increased urinary frequency.   Please see problem based assessment and plan for more information of patient's chronic medical conditions.   Past Medical History:  Diagnosis Date  . Cancer [facility])    Prostate surgery, 8 weeks of Radiation  . GERD (gastroesophageal reflux disease)   . Hemorrhoids   . HOH (hard of hearing)   . Hyperlipidemia   . Hypertension   . Inguinal hernia    left  . Osteoarthrosis     Review of Systems: Please see pertinent ROS reviewed in HPI and problem based charting.   Physical Exam: Blood pressure 122/60, pulse (!) 58, temperature 97.8 F (36.6 C), temperature source Oral, height 5' 11.5" (1.816 m), weight 175 lb 6.4 oz (79.6 kg), SpO2 100 %. General: Vital signs reviewed.  Patient is in no acute distress and cooperative with exam.   Cardiovascular: Bradycardic, regular rhythm,  no murmurs, gallops, or rubs. No JVD or carotid bruit present. No lower extremity edema bilaterally. Bilateral radial and pedal pulses are intact and symmetric bilaterally.  Pulmonary: Clear to auscultation bilaterally, no wheezes, rales, or rhonchi. No accessory muscle use. Gastrointestinal:  Soft, non-tender, non-distended, BS + Musculoskeletal: Upper-mid left back in the area of discomfort is mildly tender to palpation throughout musculature, but without point tenderness. There is no associated rash, erythema, or lesion in the area of pain. Right knee crepitus. No associated effusion, erythema or increased warmth of right knee. Skin: Warm, dry and intact. No rashes or erythema. Psychiatric: Normal mood and affect. speech and behavior is normal.   Assessment & Plan:  See encounters tab for problem based medical decision making. Patient discussed with Dr. Evette Doffing

## 2017-03-31 NOTE — Patient Instructions (Signed)
[name],  Please use voltaren gel on your right knee 4 times a day for your arthritis pain. You can continue taking naproxen as needed. Please follow-up with your urologist for your yearly follow-up. We will test her urine today given your back pain and your dark urine. We will also check your lab test for your kidneys and cholesterol. Please follow up in one year.  If your left-sided back pain worsens or does not go away, please return. It is likely due to a muscular strain.

## 2017-04-01 DIAGNOSIS — M25561 Pain in right knee: Secondary | ICD-10-CM | POA: Insufficient documentation

## 2017-04-01 DIAGNOSIS — T148XXA Other injury of unspecified body region, initial encounter: Secondary | ICD-10-CM | POA: Insufficient documentation

## 2017-04-01 DIAGNOSIS — R82998 Other abnormal findings in urine: Secondary | ICD-10-CM | POA: Insufficient documentation

## 2017-04-01 LAB — BMP8+ANION GAP
ANION GAP: 14 mmol/L (ref 10.0–18.0)
BUN / CREAT RATIO: 13 (ref 10–24)
BUN: 18 mg/dL (ref 8–27)
CHLORIDE: 105 mmol/L (ref 96–106)
CO2: 25 mmol/L (ref 18–29)
Calcium: 9.2 mg/dL (ref 8.6–10.2)
Creatinine, Ser: 1.34 mg/dL — ABNORMAL HIGH (ref 0.76–1.27)
GFR calc Af Amer: 58 mL/min/{1.73_m2} — ABNORMAL LOW (ref >59)
GFR calc non Af Amer: 50 mL/min/{1.73_m2} — ABNORMAL LOW (ref >59)
GLUCOSE: 85 mg/dL (ref 65–99)
Potassium: 4.4 mmol/L (ref 3.5–5.2)
Sodium: 144 mmol/L (ref 134–144)

## 2017-04-01 LAB — MICROSCOPIC EXAMINATION
Bacteria, UA: NONE SEEN
CASTS: NONE SEEN /LPF
EPITHELIAL CELLS (NON RENAL): NONE SEEN /HPF (ref 0–10)

## 2017-04-01 LAB — URINALYSIS, COMPLETE
BILIRUBIN UA: NEGATIVE
Glucose, UA: NEGATIVE
Ketones, UA: NEGATIVE
Nitrite, UA: NEGATIVE
PH UA: 5 (ref 5.0–7.5)
Protein, UA: NEGATIVE
RBC UA: NEGATIVE
Specific Gravity, UA: 1.021 (ref 1.005–1.030)
UUROB: 0.2 mg/dL (ref 0.2–1.0)

## 2017-04-01 LAB — LIPID PANEL
CHOLESTEROL TOTAL: 223 mg/dL — AB (ref 100–199)
Chol/HDL Ratio: 2.7 ratio (ref 0.0–5.0)
HDL: 84 mg/dL (ref >39)
LDL Calculated: 117 mg/dL — ABNORMAL HIGH (ref 0–99)
Triglycerides: 108 mg/dL (ref 0–149)
VLDL CHOLESTEROL CAL: 22 mg/dL (ref 5–40)

## 2017-04-01 NOTE — Assessment & Plan Note (Signed)
Patient does report a left posterior upper back pain for the past 3 weeks. This is a daily pain that comes and goes. It is worse with movement and palpation. He denies any falls, injuries, or new activity. He states that he is very active with exercise and does many "twisting"motions. He denies any worsening of the pain with inspiration. He denies any associated fever, chills, chest pain, shortness of breath, cough, dysuria, increased urinary frequency. Physical exam shows upper-mid left back that is mildly tender to palpation throughout musculature, but without point tenderness. There is no associated rash, erythema, or lesion in the area of pain.   Assessment: Muscular strain, likely sustained during exercise. Other differentials include compression fracture of thoracic spine with radiation of pain to left, rib fracture, metastatic disease given history of prostate cancer. These last 3 are less likely given his presentation. I offered to obtain a chest x-ray to further evaluate the area, but patient declined.  Plan: -Continue to monitor and conservative management -Patient was instructed to return if pain did not improve or worsened -At that time, I would start with a thoracic spine x-ray to evaluate for compression fracture. Metastatic disease should also be kept on the differential. The best way to evaluate for this area would be an MRI.

## 2017-04-01 NOTE — Assessment & Plan Note (Signed)
Given 3 week history of left-sided back pain, urinary symptoms were assessed. Patient denied dysuria, increased urinary hesitancy. However, patient does state that he has dark urine off and on. He states that recently his urine looked more green. A urine sample was collected and overall normal. We will continue to monitor.  Results for HOA, DERISO (MRN [id number]) as of 04/01/2017 13:18  Ref. Range 03/31/2017 14:21  Appearance Ur Latest Ref Range: Clear  Clear  Bacteria, UA Latest Ref Range: None seen/Few  None seen  Bilirubin, UA Latest Ref Range: Negative  Negative  Casts Latest Ref Range: None seen /lpf None seen  Color, UA Latest Ref Range: Yellow  Yellow  Epithelial Cells (non renal) Latest Ref Range: 0 - 10 /hpf None seen  Glucose Latest Ref Range: Negative  Negative  Ketones, UA Latest Ref Range: Negative  Negative  Leukocytes, UA Latest Ref Range: Negative  Trace (A)  Microscopic Examination Unknown See below:  Mucus, UA Latest Ref Range: Not Estab.  Present  Nitrite, UA Latest Ref Range: Negative  Negative  pH, UA Latest Ref Range: 5.0 - 7.5  5.0  Protein, UA Latest Ref Range: Negative/Trace  Negative  RBC, UA Latest Ref Range: Negative  Negative  Specific Gravity, UA Latest Ref Range: 1.005 - 1.030  1.021  WBC, UA Latest Ref Range: 0 - 5 /hpf 0-5

## 2017-04-01 NOTE — Assessment & Plan Note (Signed)
Patient has a history of hyperlipidemia and reports compliance with pravastatin 20 mg daily. He is on pravastatin for primary prevention. Repeat lipid panel shows stable cholesterol. Given patient's stability on this dose and use of statin for primary prevention, he does not likely need repeat lipid panel in the future.  Assessment: Hyperlipidemia  Land: -Continue pravastatin 20 mg daily

## 2017-04-01 NOTE — Assessment & Plan Note (Signed)
Patient has a history of chronic right knee pain likely secondary to osteoarthritis. He states that this pain is chronic for him and unchanged over several years. He will occasionally take naproxen with relief of his pain. Pain is located throughout the right knee and not in one particular area. He denies any recent injury, falls, new activity. Pain is worse with activity and better with rest. He has never had formal evaluation of the right knee for osteoarthritis. On examination, right knee exhibits crepitus. No associated effusion, erythema or increased warmth of right knee.  Assessment: Chronic right knee pain likely secondary to osteoarthritis versus patellofemoral arthritis/syndrome.   Plan: -Prescribed Voltaren gel 4 times a day -If pain were to worsen, consider right knee x-ray in [facility] to evaluate for patellofemoral syndrome

## 2017-04-01 NOTE — Assessment & Plan Note (Addendum)
BP Readings from Last 3 Encounters:  03/31/17 122/60  04/01/16 132/69  11/27/15 (!) 164/84    Lab Results  Component Value Date   NA 144 03/31/2017   K 4.4 03/31/2017   CREATININE 1.34 (H) 03/31/2017    Assessment: Blood pressure control:  controlled Progress toward BP goal:   at goal Comments: Patient reports compliance with amlodipine 10 mg daily, lisinopril 10 mg daily, tamsulosin 0.8 mg daily at bedtime. He denies any lightheadedness.   Plan: Medications:  continue current medications Other plans: Follow up in 1 year. Repeat basic metabolic panel showed normal potassium, sodium, chloride. Creatinine is slightly higher than his normal baseline at 1.34. Baseline appears to be around 1.1. We will continue to monitor.

## 2017-04-01 NOTE — Assessment & Plan Note (Signed)
Patient has a history of prostate cancer and is status post radiation in 2011. Patient follows on a yearly basis with urology. He has been on tamsulosin 0.8 mg daily. Previous urology note suggested tapering down tamsulosin. When discussing with the patient, he states that his urinary symptoms are well controlled on this dose and he denies lightheadedness. He states then they have tried to taper this medication before he had worsening urinary hesitancy. He prefers to stay on the 0.8 mg dose at this time. Last office visit with urology was in April 2017.  Assessment: History of prostate cancer status post radiation  Plan: -Continue tamsulosin 0.8 mg daily -Follow-up with urology

## 2017-04-02 NOTE — Progress Notes (Signed)
Internal Medicine Clinic Attending  Case discussed with Dr. Burns at the time of the visit.  We reviewed the resident's history and exam and pertinent patient test results.  I agree with the assessment, diagnosis, and plan of care documented in the resident's note.  

## 2017-04-05 ENCOUNTER — Telehealth: Payer: Self-pay | Admitting: *Deleted

## 2017-04-05 NOTE — Telephone Encounter (Signed)
Call to [facility] for PA for Diclofenac Gel.  Approved 04/05/2017 thru 11/01/2017.  Call to [facility] at [phone] to notify of the approval.  Sander Nephew, RN 04/05/2017 9:19 AM.

## 2017-04-07 ENCOUNTER — Encounter: Payer: Self-pay | Admitting: *Deleted

## 2017-05-09 ENCOUNTER — Encounter (HOSPITAL_COMMUNITY): Payer: Self-pay

## 2017-05-09 ENCOUNTER — Emergency Department (HOSPITAL_COMMUNITY)
Admission: EM | Admit: 2017-05-09 | Discharge: 2017-05-09 | Disposition: A | Discharge status: Home / Self Care | Payer: Medicare Other | Attending: Emergency Medicine | Admitting: Emergency Medicine

## 2017-05-09 DIAGNOSIS — Z8546 Personal history of malignant neoplasm of prostate: Secondary | ICD-10-CM | POA: Diagnosis not present

## 2017-05-09 DIAGNOSIS — R3129 Other microscopic hematuria: Secondary | ICD-10-CM | POA: Diagnosis not present

## 2017-05-09 DIAGNOSIS — Z79899 Other long term (current) drug therapy: Secondary | ICD-10-CM | POA: Diagnosis not present

## 2017-05-09 DIAGNOSIS — Z880 Allergy status to penicillin: Secondary | ICD-10-CM | POA: Diagnosis not present

## 2017-05-09 DIAGNOSIS — Z87891 Personal history of nicotine dependence: Secondary | ICD-10-CM | POA: Diagnosis not present

## 2017-05-09 DIAGNOSIS — R339 Retention of urine, unspecified: Secondary | ICD-10-CM | POA: Diagnosis present

## 2017-05-09 DIAGNOSIS — R319 Hematuria, unspecified: Secondary | ICD-10-CM | POA: Diagnosis not present

## 2017-05-09 DIAGNOSIS — I10 Essential (primary) hypertension: Secondary | ICD-10-CM | POA: Diagnosis not present

## 2017-05-09 LAB — URINALYSIS, ROUTINE W REFLEX MICROSCOPIC
BILIRUBIN URINE: NEGATIVE
Glucose, UA: NEGATIVE mg/dL
KETONES UR: NEGATIVE mg/dL
NITRITE: NEGATIVE
PH: 7 (ref 5.0–8.0)
Protein, ur: NEGATIVE mg/dL
Specific Gravity, Urine: 1.015 (ref 1.005–1.030)

## 2017-05-09 LAB — URINALYSIS, MICROSCOPIC (REFLEX)

## 2017-05-09 NOTE — ED Notes (Signed)
Bladder Scan done on PT, Found 46ml

## 2017-05-09 NOTE — ED Notes (Signed)
Pt and family states they understand instructions. Home stable with steadty gait.

## 2017-05-09 NOTE — ED Triage Notes (Addendum)
Patient reports this am with pain upon urination and only urinating small amounts. Complains of bladder pressure, no hx of retention
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
## 2017-05-12 ENCOUNTER — Ambulatory Visit (INDEPENDENT_AMBULATORY_CARE_PROVIDER_SITE_OTHER): Payer: Medicare Other | Admitting: Internal Medicine

## 2017-05-12 ENCOUNTER — Encounter: Payer: Self-pay | Admitting: Internal Medicine

## 2017-05-12 DIAGNOSIS — Z79899 Other long term (current) drug therapy: Secondary | ICD-10-CM

## 2017-05-12 DIAGNOSIS — E785 Hyperlipidemia, unspecified: Secondary | ICD-10-CM | POA: Diagnosis not present

## 2017-05-12 DIAGNOSIS — Z8546 Personal history of malignant neoplasm of prostate: Secondary | ICD-10-CM

## 2017-05-12 DIAGNOSIS — I10 Essential (primary) hypertension: Secondary | ICD-10-CM

## 2017-05-12 DIAGNOSIS — Z87891 Personal history of nicotine dependence: Secondary | ICD-10-CM | POA: Diagnosis not present

## 2017-05-12 DIAGNOSIS — Z923 Personal history of irradiation: Secondary | ICD-10-CM

## 2017-05-12 NOTE — Assessment & Plan Note (Signed)
Patient was informed that his blood pressure was not elevated and to continue his lisinopril

## 2017-05-12 NOTE — Patient Instructions (Addendum)
Thank you for returning for your visit with [place] today. It was a pleasure seeing you. I hope that we have resolved all of your issues today. Please feel free to call with any questions.  1. Addressed your concern of urinary retention for one night and hematuria 2. Viewed your prescriptions for refill needs, non needed at this time.  3. Ordered PSA, will return to clinic for blood draw

## 2017-05-12 NOTE — Progress Notes (Signed)
   CC: Patient presented for a routine visit.   HPI: [name] is a 82 y.o. male presenting for a routine checkup. He has as pertinent past medical history for hypertension, prostate cancer, and hyperlipidemia. Patient stated that he had been to the ED on the 8th for urinary retention that developed overnight and resolved after drinking several glasses of water and voiding in the ED. He was able to give a urine sample after voiding as well that demonstrated hematuria as per the lab report. The retention resolved and a bladder scan showed only approximately 19ml remaining. Dysuria was associated with the rentention as was discoloration of the urine voided. After voiding, the patient was discharged with stable vitals. He does not complain of urinary symptoms since that time nor does he claim to have experienced this in the past.   Past Medical History:  Diagnosis Date  . Cancer [facility])    Prostate surgery, 8 weeks of Radiation  . GERD (gastroesophageal reflux disease)   . Hemorrhoids   . HOH (hard of hearing)   . Hyperlipidemia   . Hypertension   . Inguinal hernia    left  . Osteoarthrosis    Review of Systems:  Review of Systems  Constitutional: Negative for chills, fever, malaise/fatigue and weight loss.  HENT: Positive for hearing loss. Negative for ear discharge, ear pain and sore throat.   Eyes: Negative for blurred vision and pain.  Respiratory: Negative for cough, sputum production and shortness of breath.   Cardiovascular: Negative for chest pain, palpitations, orthopnea, claudication and leg swelling.  Gastrointestinal: Negative for abdominal pain, constipation, diarrhea, heartburn, nausea and vomiting.  Genitourinary: Positive for hematuria. Negative for dysuria, flank pain, frequency and urgency.  Musculoskeletal: Negative for myalgias and neck pain.  Skin: Negative for itching and rash.  Neurological: Negative for dizziness, tingling and headaches.  Psychiatric/Behavioral:  Negative for depression. The patient is not nervous/anxious.    Physical Exam:  Vitals:   05/12/17 1514  BP: (!) 118/59  Pulse: 61  Temp: 98 F (36.7 C)  TempSrc: Oral  SpO2: 100%  Weight: 175 lb 4.8 oz (79.5 kg)   Physical Exam  Constitutional: He is oriented to person, place, and time. He appears well-developed. No distress.  HENT:  Head: Normocephalic and atraumatic.  Right Ear: Tympanic membrane, external ear and ear canal normal.  Left Ear: Tympanic membrane, external ear and ear canal normal.  Nose: Nose normal.  Mouth/Throat: Uvula is midline, oropharynx is clear and moist and mucous membranes are normal.  Eyes: Pupils are equal, round, and reactive to light.  Neck: Normal range of motion.  Cardiovascular: Normal rate, regular rhythm and normal heart sounds.   Pulmonary/Chest: Effort normal and breath sounds normal.  Abdominal: Soft. Bowel sounds are normal.  Neurological: He is alert and oriented to person, place, and time.  Skin: Skin is warm and dry. Capillary refill takes less than 2 seconds. He is not diaphoretic.  Psychiatric: He has a normal mood and affect.    Assessment & Plan:   See Encounters Tab for problem based charting.  Patient seen with Dr. Beryle Beams

## 2017-05-12 NOTE — Assessment & Plan Note (Addendum)
Patient presented with complaints of hematuria, and urinary retention on July 8 of this year approximately 4 days prior. Retention and hematuria resolved in under 12 hours as per patient.  Patient was advised to call his urologist as soon as he is able to obtain a follow-up appointment. PSA will be ordered for follow-up

## 2017-05-12 NOTE — Assessment & Plan Note (Signed)
Patient denied myalgias, thigh pain or difficulty going upstairs due to weakness/pain of the thighs.  Advised to continue taking this medication

## 2017-05-13 NOTE — Progress Notes (Signed)
Medicine attending: I personally interviewed and briefly examined this patient on the day of the patient visit and reviewed pertinent clinical ,laboratory, and radiographic data  with resident physician Dr.Lawrence Harbrecht and we discussed a management plan. The patient is 7 years post primary Radiation Rx for prostate cancer. Will will check a PSA. We encouraged him to discontinue tamsulosin.

## 2017-06-11 ENCOUNTER — Other Ambulatory Visit: Payer: Self-pay | Admitting: *Deleted

## 2017-06-11 MED ORDER — PRAVASTATIN SODIUM 20 MG PO TABS: 20.0000 mg | ORAL_TABLET | Freq: Every day | ORAL | 0 refills | Status: DC

## 2017-07-08 ENCOUNTER — Other Ambulatory Visit: Payer: Self-pay | Admitting: *Deleted

## 2017-07-08 DIAGNOSIS — I10 Essential (primary) hypertension: Secondary | ICD-10-CM

## 2017-07-08 MED ORDER — AMLODIPINE BESYLATE 10 MG PO TABS: 10.0000 mg | ORAL_TABLET | Freq: Every day | ORAL | 3 refills | Status: DC

## 2017-08-26 NOTE — Addendum Note (Signed)
Addended by: Orson Gear on: 08/26/2017 03:29 PM   Modules accepted: Orders

## 2017-09-20 ENCOUNTER — Other Ambulatory Visit: Payer: Self-pay | Admitting: Internal Medicine

## 2017-09-20 NOTE — Telephone Encounter (Signed)
Patient needs to be seen prior to additional refills. Was to return in three months as of July.

## 2017-11-08 ENCOUNTER — Encounter: Payer: Self-pay | Admitting: *Deleted

## 2017-12-06 ENCOUNTER — Other Ambulatory Visit: Payer: Self-pay | Admitting: Internal Medicine

## 2017-12-08 ENCOUNTER — Encounter: Payer: Medicare Other | Admitting: Internal Medicine

## 2017-12-15 ENCOUNTER — Encounter: Payer: Medicare Other | Admitting: Internal Medicine

## 2017-12-28 ENCOUNTER — Other Ambulatory Visit: Payer: Self-pay | Admitting: Internal Medicine

## 2018-02-02 ENCOUNTER — Other Ambulatory Visit: Payer: Self-pay

## 2018-02-02 ENCOUNTER — Encounter: Payer: Self-pay | Admitting: Internal Medicine

## 2018-02-02 ENCOUNTER — Ambulatory Visit (INDEPENDENT_AMBULATORY_CARE_PROVIDER_SITE_OTHER): Payer: Medicare Other | Admitting: Internal Medicine

## 2018-02-02 ENCOUNTER — Encounter (INDEPENDENT_AMBULATORY_CARE_PROVIDER_SITE_OTHER): Payer: Self-pay

## 2018-02-02 VITALS — BP 138/63 | HR 63 | Temp 98.0°F | Ht 71.0 in | Wt 176.1 lb

## 2018-02-02 DIAGNOSIS — Z79899 Other long term (current) drug therapy: Secondary | ICD-10-CM

## 2018-02-02 DIAGNOSIS — R82998 Other abnormal findings in urine: Secondary | ICD-10-CM | POA: Diagnosis not present

## 2018-02-02 DIAGNOSIS — M25561 Pain in right knee: Secondary | ICD-10-CM

## 2018-02-02 DIAGNOSIS — E785 Hyperlipidemia, unspecified: Secondary | ICD-10-CM | POA: Diagnosis not present

## 2018-02-02 DIAGNOSIS — Z8719 Personal history of other diseases of the digestive system: Secondary | ICD-10-CM | POA: Diagnosis not present

## 2018-02-02 DIAGNOSIS — Z8546 Personal history of malignant neoplasm of prostate: Secondary | ICD-10-CM | POA: Diagnosis not present

## 2018-02-02 DIAGNOSIS — G8929 Other chronic pain: Secondary | ICD-10-CM

## 2018-02-02 DIAGNOSIS — I10 Essential (primary) hypertension: Secondary | ICD-10-CM

## 2018-02-02 DIAGNOSIS — M1711 Unilateral primary osteoarthritis, right knee: Secondary | ICD-10-CM

## 2018-02-02 LAB — POCT URINALYSIS DIPSTICK
Bilirubin, UA: NEGATIVE
Glucose, UA: NEGATIVE
Ketones, UA: NEGATIVE
NITRITE UA: NEGATIVE
RBC UA: NEGATIVE
Spec Grav, UA: 1.025 (ref 1.010–1.025)
Urobilinogen, UA: 0.2 E.U./dL
pH, UA: 7.5 (ref 5.0–8.0)

## 2018-02-02 MED ORDER — DICLOFENAC SODIUM 1 % TD GEL: 2.0000 g | Freq: Four times a day (QID) | TRANSDERMAL | 3 refills | Status: DC

## 2018-02-02 MED ORDER — LISINOPRIL 10 MG PO TABS: 10.0000 mg | ORAL_TABLET | Freq: Every day | ORAL | 3 refills | Status: DC

## 2018-02-02 MED ORDER — PRAVASTATIN SODIUM 20 MG PO TABS: 20.0000 mg | ORAL_TABLET | Freq: Every day | ORAL | 3 refills | Status: DC

## 2018-02-02 NOTE — Assessment & Plan Note (Signed)
HLD: Patient has a history of hyperlipidemia which is mild and well controlled on pravastatin.  As recent literature feels indicate a significant mortality and if it in primary prevention of people over the age of 7 I do not think a repeat lipid profile is appropriate nor increasing of his statin.  He desires to continue on the pravastatin and at this low dose I agree that this is appropriate. Refill pravastatin

## 2018-02-02 NOTE — Patient Instructions (Signed)
FOLLOW-UP INSTRUCTIONS When: 6 months For: routine What to bring: all of your medications  Thank you for your visit to the [facility].  I need you to follow-up with your urologist as soon as possible regarding you prostate cancer history and dark urine. Although what you describe leads me to believe that the dark urine is concentrated urine from dehydration, I am would like for you to see them as well.

## 2018-02-02 NOTE — Progress Notes (Signed)
CC: Dark urine  HPI:[name] is a 83 y.o. male who presents today for evaluation of his chronic medical conditions and dark urine times 2 weeks.  Dark urine: History of prostate cancer: The patient presents today for evaluation of dark urine that began approximate 2 weeks prior and is associated with mild, very mild dysuria.  This is dramatic improvement in the color of his urine with significant increase in his p.o. hydration.  He also attested to a history of mild constipation.  I suspect that the patient's dark urine is related to dehydration but agree that he needs to be evaluated by his urologist given his history.  The patient's dipstick was negative for glucose, bili, ketones, blood, and positive only for protein of 30 mg/decaliter and small leukocytes.  Nitrite negative as well.  He stated that he is to follow-up with his urologist in approximately 1 week. Plan: I will check a PSA today I have encouraged him to make his urology visit and to call us if his urine again becomes dark and does not resolve or develops other concerning urinary symptoms  Hypertension: Patient blood pressure is well controlled today at 138/63.  No adjustments indicated Plan  continue lisinopril 10 mg daily, refilled Obtain BMP for creatinine and electrolyte monitor   HLD: Patient has a history of hyperlipidemia which is mild and well controlled on pravastatin.  As recent literature feels indicate a significant mortality and if it in primary prevention of people over the age of 54 I do not think a repeat lipid profile is appropriate nor increasing of his statin.  He desires to continue on the pravastatin and at this low dose I agree that this is appropriate. Refill pravastatin  Right knee pain: This is been of approximately 2 weeks to 3 months.  Is worse with walking and he denies any falls.  Is better with rest at times, is intermittent, has not significantly impacted his ADLs and only an issue at  times.  He attested the cold makes the pain worse, warmth improves the symptoms but again, they are intermittent even with regard to the temperature.  He has not tried ibuprofen or other medications for the knee and does not wish to to seek treatment today.  He says the pain is currently resolved and is minimal on his most recent occurrence.  We will continue to monitor this and evaluate this pain if it fails to resolve or worsens.  On physical exam there is no marked difference between the right and left knee, no acute focal tender point on palpation, with mild  generalized tenderness of the medial aspect of the right knee.  There is no joint laxity, locking sensation, grinding sensation or other concerning motion with his evaluation.  The patient was above advised to call the clinic if the pain worsens or fails to resolve or develops other new concerning symptoms. I have refilled his prescription for Voltaren gel which she will applied to this area.   Past Medical History:  Diagnosis Date  . Cancer [facility])    Prostate surgery, 8 weeks of Radiation  . GERD (gastroesophageal reflux disease)   . Hemorrhoids   . HOH (hard of hearing)   . Hyperlipidemia   . Hypertension   . Inguinal hernia    left  . Osteoarthrosis    Review of Systems: ROS negative except as per HPI  Physical Exam:  Vitals:   02/02/18 1543  BP: 138/63  Pulse: 63  Temp: 98 F (  36.7 C)  SpO2: 100%  Weight: 176 lb 1.6 oz (79.9 kg)  Height: 5\' 11"  (1.803 m)   General: No acute distress, conversant, afebrile Neuro: No focal deficits, alert and oriented x4 Cardiovascular: RRR, no murmurs rubs or gallops appreciated Pulmonary: Bilateral lung fields clear to auscultation GI: Abdomen soft, nontender, nondistended Musculoskeletal: Evaluation of the right knee failed to reveal laxity of the joint, acute focal point tenderness, locking or popping sensation concerning for for meniscal tear.  Assessment & Plan:   See Encounters  Tab for problem based charting.  Patient discussed with Dr. Lynnae January

## 2018-02-02 NOTE — Assessment & Plan Note (Signed)
  Right knee pain: This is been of approximately 2 weeks to 3 months.  Is worse with walking and he denies any falls.  Is better with rest at times, is intermittent, has not significantly impacted his ADLs and only an issue at times.  He attested the cold makes the pain worse, warmth improves the symptoms but again, they are intermittent even with regard to the temperature.  He has not tried ibuprofen or other medications for the knee and does not wish to to seek treatment today.  He says the pain is currently resolved and is minimal on his most recent occurrence.  We will continue to monitor this and evaluate this pain if it fails to resolve or worsens.  On physical exam there is no marked difference between the right and left knee, no acute focal tender point on palpation, with mild  generalized tenderness of the medial aspect of the right knee.  There is no joint laxity, locking sensation, grinding sensation or other concerning motion with his evaluation.  The patient was above advised to call the clinic if the pain worsens or fails to resolve or develops other new concerning symptoms. I have refilled his prescription for Voltaren gel which she will applied to this area.

## 2018-02-02 NOTE — Assessment & Plan Note (Signed)
  Dark urine: History of prostate cancer: The patient presents today for evaluation of dark urine that began approximate 2 weeks prior and is associated with mild, very mild dysuria.  This is dramatic improvement in the color of his urine with significant increase in his p.o. hydration.  He also attested to a history of mild constipation.  I suspect that the patient's dark urine is related to dehydration but agree that he needs to be evaluated by his urologist given his history.  The patient's dipstick was negative for glucose, bili, ketones, blood, and positive only for protein of 30 mg/decaliter and small leukocytes.  Nitrite negative as well.  He stated that he is to follow-up with his urologist in approximately 1 week. Plan: I will check a PSA today I have encouraged him to make his urology visit and to call us if his urine again becomes dark and does not resolve or develops other concerning urinary symptoms

## 2018-02-02 NOTE — Assessment & Plan Note (Signed)
Hypertension: Patient blood pressure is well controlled today at 138/63.  No adjustments indicated Plan  continue lisinopril 10 mg daily, refilled Obtain BMP for creatinine and electrolyte monitor

## 2018-02-03 LAB — PSA: Prostate Specific Ag, Serum: 0.5 ng/mL (ref 0.0–4.0)

## 2018-02-03 LAB — BMP8+ANION GAP
ANION GAP: 14 mmol/L (ref 10.0–18.0)
BUN/Creatinine Ratio: 12 (ref 10–24)
BUN: 16 mg/dL (ref 8–27)
CHLORIDE: 106 mmol/L (ref 96–106)
CO2: 24 mmol/L (ref 20–29)
Calcium: 9.1 mg/dL (ref 8.6–10.2)
Creatinine, Ser: 1.3 mg/dL — ABNORMAL HIGH (ref 0.76–1.27)
GFR calc Af Amer: 60 mL/min/{1.73_m2} (ref >59)
GFR calc non Af Amer: 52 mL/min/{1.73_m2} — ABNORMAL LOW (ref >59)
Glucose: 86 mg/dL (ref 65–99)
Potassium: 4.8 mmol/L (ref 3.5–5.2)
Sodium: 144 mmol/L (ref 134–144)

## 2018-02-03 NOTE — Progress Notes (Signed)
Internal Medicine Clinic Attending  Case discussed with Dr. Harbrecht at the time of the visit.  We reviewed the resident's history and exam and pertinent patient test results.  I agree with the assessment, diagnosis, and plan of care documented in the resident's note.   

## 2018-04-14 ENCOUNTER — Other Ambulatory Visit: Payer: Self-pay | Admitting: Internal Medicine

## 2018-04-14 DIAGNOSIS — I10 Essential (primary) hypertension: Secondary | ICD-10-CM

## 2018-04-14 NOTE — Telephone Encounter (Signed)
Next appt scheduled  10/02 with PCP.

## 2018-04-26 ENCOUNTER — Other Ambulatory Visit: Payer: Self-pay

## 2018-04-26 NOTE — Patient Outreach (Signed)
[facility]) Care Management  04/26/2018  [name] [birth year] [id number]   Medication Adherence call to Mr. [name] left a message for patient to call back patient is due on Lisinopril 20 mg [name] said last time it was pick up was in May/2019 patient is due for this medication.CliffordStuart is showing past due under [place].   [facility] Direct Dial [phone]  Fax [phone] [email or web address]

## 2018-07-13 ENCOUNTER — Other Ambulatory Visit: Payer: Self-pay | Admitting: Internal Medicine

## 2018-07-13 DIAGNOSIS — I10 Essential (primary) hypertension: Secondary | ICD-10-CM

## 2018-07-13 NOTE — Telephone Encounter (Signed)
Next appt scheduled 10/2 with PCP. 

## 2018-08-03 ENCOUNTER — Encounter: Payer: Self-pay | Admitting: Internal Medicine

## 2018-08-03 ENCOUNTER — Other Ambulatory Visit: Payer: Self-pay

## 2018-08-03 ENCOUNTER — Ambulatory Visit (INDEPENDENT_AMBULATORY_CARE_PROVIDER_SITE_OTHER): Payer: Medicare Other | Admitting: Internal Medicine

## 2018-08-03 VITALS — BP 118/62 | HR 65 | Temp 98.4°F | Wt 171.4 lb

## 2018-08-03 DIAGNOSIS — R7989 Other specified abnormal findings of blood chemistry: Secondary | ICD-10-CM

## 2018-08-03 DIAGNOSIS — H9193 Unspecified hearing loss, bilateral: Secondary | ICD-10-CM | POA: Diagnosis not present

## 2018-08-03 DIAGNOSIS — Z79899 Other long term (current) drug therapy: Secondary | ICD-10-CM | POA: Diagnosis not present

## 2018-08-03 DIAGNOSIS — R82998 Other abnormal findings in urine: Secondary | ICD-10-CM

## 2018-08-03 DIAGNOSIS — I10 Essential (primary) hypertension: Secondary | ICD-10-CM | POA: Diagnosis not present

## 2018-08-03 DIAGNOSIS — Z299 Encounter for prophylactic measures, unspecified: Secondary | ICD-10-CM

## 2018-08-03 DIAGNOSIS — H9190 Unspecified hearing loss, unspecified ear: Secondary | ICD-10-CM

## 2018-08-03 MED ORDER — AMLODIPINE BESYLATE 10 MG PO TABS: 10.0000 mg | ORAL_TABLET | Freq: Every day | ORAL | 2 refills | Status: DC

## 2018-08-03 NOTE — Assessment & Plan Note (Addendum)
BMP Latest Ref Rng & Units 02/02/2018 03/31/2017 11/26/2015  Glucose 65 - 99 mg/dL 86 85 98  BUN 8 - 27 mg/dL 16 18 14   Creatinine 0.76 - 1.27 mg/dL 1.30(H) 1.34(H) 1.14  BUN/Creat Ratio 10 - 24 12 13  -  Sodium 134 - 144 mmol/L 144 144 142  Potassium 3.5 - 5.2 mmol/L 4.8 4.4 4.1  Chloride 96 - 106 mmol/L 106 105 107  CO2 20 - 29 mmol/L 24 25 28   Calcium 8.6 - 10.2 mg/dL 9.1 9.2 9.3   Elevated Creatinine: Will obtain BMP today to better quantify.  Elevated again on 10/02 as below. Patient was unavailable by phone. Will attempt to contact again. Patient remains asymptomatic. Will need UA complete at next visit. Patient urinating without concern. Rate and volume stable. Most recent UA absent Hgb. Will need to consider biopsy and nephrology consult if UA nondiagnostic on next visit given he remains asymptomatic.  BMP Latest Ref Rng & Units 08/03/2018 02/02/2018 03/31/2017  Glucose 65 - 99 mg/dL 74 86 85  BUN 8 - 27 mg/dL 25 16 18   Creatinine 0.76 - 1.27 mg/dL 1.47(H) 1.30(H) 1.34(H)  BUN/Creat Ratio 10 - 24 17 12 13   Sodium 134 - 144 mmol/L 142 144 144  Potassium 3.5 - 5.2 mmol/L 5.2 4.8 4.4  Chloride 96 - 106 mmol/L 104 106 105  CO2 20 - 29 mmol/L 24 24 25   Calcium 8.6 - 10.2 mg/dL 8.7 9.1 9.2

## 2018-08-03 NOTE — Assessment & Plan Note (Signed)
  Dark Urine: Rarely his urine will be dark yellow, most commonly when he doesn't drink enough water. Denied hematuria, no hesitancy, no dysuria. Patient will need UA at next visit given his history of hemoglobinuria on his UA in July 2018.

## 2018-08-03 NOTE — Assessment & Plan Note (Signed)
HTN: BP 118/62. Denied dizziness, lightheadedness, weakness, orthostatic symptoms, blurry vision, or hypertension. Continue lisinopril and amlodipine Obtain BMP today for renal function evaluation given increased creatinine on his two most recent BMP's

## 2018-08-03 NOTE — Assessment & Plan Note (Signed)
Hearing loss, bilateral, unspecified: Patient stated that his fiance is concerned that his ability to hear is not what it was in the past. He stated that perhaps his hearing was diminished but that he was unsure. He was amenable to further evaluation by an audiologist to better quantify the degree and type of hearing loss. I was unable to appreciate a clear degree of hearing loss during my exam but the patient did demonstrate signs of hearing impairment by leaning forward toward the clinician during the encounter and requesting that I repeat questions on occasion in a quiet room while approximately 1.5 meters away.

## 2018-08-03 NOTE — Patient Instructions (Addendum)
FOLLOW-UP INSTRUCTIONS When: 6 months For: Routine visit What to bring: All of your medications  Thank you for your visit to the [facility] today.  I have refilled your Amlodipine. We are not currently making any medication changes. Please return in six months.  I would recommend that you obtain the pneumonia vaccine (PCV13) in the next few months.   I will mail you a copy of your lab results as soon as they are available to me.

## 2018-08-03 NOTE — Progress Notes (Signed)
   CC: Routine visit for HTN and hearing loss  HPI:[name] is a 83 y.o. male who presents today for routine evaluation. Please see individual problem based assessment and plan for details.  Hearing loss, bilateral, unspecified: Patient stated that his fiance is concerned that his ability to hear is not what it was in the past. He stated that perhaps his hearing was diminished but that he was unsure. He was amenable to further evaluation by an audiologist to better quantify the degree and type of hearing loss. I was unable to appreciate a clear degree of hearing loss during my exam but the patient did demonstrate signs of hearing impairment by leaning forward toward the clinician during the encounter and requesting that I repeat questions on occasion in a quiet room while approximately 1.5 meters away.   Healthcare Maintenance: Would like to defer pneumonia shot today. Had influenza vaccine recently.  Patient advised to obtain PNA PCV13 vaccine in the next month.  HTN: BP 118/62. Denied dizziness, lightheadedness, weakness, orthostatic symptoms, blurry vision, or hypertension. Continue lisinopril and amlodipine Obtain BMP today for renal function evaluation given increased creatinine on his two most recent BMP's BMP Latest Ref Rng & Units 02/02/2018 03/31/2017 11/26/2015  Glucose 65 - 99 mg/dL 86 85 98  BUN 8 - 27 mg/dL 16 18 14   Creatinine 0.76 - 1.27 mg/dL 1.30(H) 1.34(H) 1.14  BUN/Creat Ratio 10 - 24 12 13  -  Sodium 134 - 144 mmol/L 144 144 142  Potassium 3.5 - 5.2 mmol/L 4.8 4.4 4.1  Chloride 96 - 106 mmol/L 106 105 107  CO2 20 - 29 mmol/L 24 25 28   Calcium 8.6 - 10.2 mg/dL 9.1 9.2 9.3   Elevated Creatinine: Will obtain BMP today to better quantify.   Dark Urine: Rarely his urine will be dark yellow, most commonly when he doesn't drink enough water. Denied hematuria, no hesitancy, no dysuria. Patient will need UA at next visit given his history of hemoglobinuria on his UA in  July 2018.   Past Medical History:  Diagnosis Date  . Cancer [facility])    Prostate surgery, 8 weeks of Radiation  . GERD (gastroesophageal reflux disease)   . Hemorrhoids   . HOH (hard of hearing)   . Hyperlipidemia   . Hypertension   . Inguinal hernia    left  . Osteoarthrosis    Review of Systems:  ROS negative except as per HPI.  Physical Exam:  Vitals:   08/03/18 1319  BP: 118/62  Pulse: 65  Temp: 98.4 F (36.9 C)  TempSrc: Oral  SpO2: 100%  Weight: 171 lb 6.4 oz (77.7 kg)   General: A/O x4, in no acute distress, afebrile, nondiaphoretic Cardio: RRR, no mrg's Pulm: CTA bilaterally, no wheezing, rhonchi  MSK: Bilateral lower extremities, nontender, nonedematous   Assessment & Plan:   See Encounters Tab for problem based charting.  Patient discussed with Dr. Evette Doffing

## 2018-08-03 NOTE — Assessment & Plan Note (Signed)
  Healthcare Maintenance: Would like to defer pneumonia shot today. Had influenza vaccine recently.  Patient advised to obtain PNA PCV13 vaccine in the next month.

## 2018-08-04 LAB — BMP8+ANION GAP
ANION GAP: 14 mmol/L (ref 10.0–18.0)
BUN/Creatinine Ratio: 17 (ref 10–24)
BUN: 25 mg/dL (ref 8–27)
CO2: 24 mmol/L (ref 20–29)
CREATININE: 1.47 mg/dL — AB (ref 0.76–1.27)
Calcium: 8.7 mg/dL (ref 8.6–10.2)
Chloride: 104 mmol/L (ref 96–106)
GFR calc Af Amer: 51 mL/min/{1.73_m2} — ABNORMAL LOW (ref >59)
GFR, EST NON AFRICAN AMERICAN: 44 mL/min/{1.73_m2} — AB (ref >59)
GLUCOSE: 74 mg/dL (ref 65–99)
Potassium: 5.2 mmol/L (ref 3.5–5.2)
SODIUM: 142 mmol/L (ref 134–144)

## 2018-08-04 NOTE — Progress Notes (Signed)
Internal Medicine Clinic Attending  Case discussed with Dr. Harbrecht at the time of the visit.  We reviewed the resident's history and exam and pertinent patient test results.  I agree with the assessment, diagnosis, and plan of care documented in the resident's note.   

## 2018-08-05 ENCOUNTER — Encounter: Payer: Self-pay | Admitting: Internal Medicine

## 2018-08-05 NOTE — Progress Notes (Signed)
BMP elevated again today. Will continue to monitor. We will obtain urine studies at his next visit. UA, albumin/creatinine ratio to better quantify.   Kathi Ludwig, MD Internal Medicine PGY-2

## 2018-08-22 ENCOUNTER — Encounter: Payer: Self-pay | Admitting: Internal Medicine

## 2018-08-22 ENCOUNTER — Other Ambulatory Visit: Payer: Self-pay

## 2018-08-22 ENCOUNTER — Ambulatory Visit (INDEPENDENT_AMBULATORY_CARE_PROVIDER_SITE_OTHER): Payer: Medicare Other | Admitting: Internal Medicine

## 2018-08-22 VITALS — BP 140/64 | HR 64 | Temp 97.6°F | Ht 71.5 in | Wt 171.8 lb

## 2018-08-22 DIAGNOSIS — Z8719 Personal history of other diseases of the digestive system: Secondary | ICD-10-CM | POA: Diagnosis not present

## 2018-08-22 DIAGNOSIS — H903 Sensorineural hearing loss, bilateral: Secondary | ICD-10-CM | POA: Diagnosis not present

## 2018-08-22 DIAGNOSIS — Z87438 Personal history of other diseases of male genital organs: Secondary | ICD-10-CM

## 2018-08-22 DIAGNOSIS — N50811 Right testicular pain: Secondary | ICD-10-CM | POA: Diagnosis not present

## 2018-08-22 DIAGNOSIS — R319 Hematuria, unspecified: Secondary | ICD-10-CM | POA: Diagnosis not present

## 2018-08-22 DIAGNOSIS — Z8546 Personal history of malignant neoplasm of prostate: Secondary | ICD-10-CM | POA: Diagnosis not present
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
## 2018-08-22 NOTE — Patient Instructions (Signed)
FOLLOW-UP INSTRUCTIONS When: If your symptoms fails to resolve or worsen What to bring: All of your medications  I have made recommended that you use Motrin 400mg  no more than 2-3 times daily for the pain. Also, it is important that you obtain a jock strap and briefs to wear to support the testicles.    Today we discussed your testicular pain. I will obtain a .   I will notify you of the results of any labs from today's evaluation when available to me.   As always if your symptoms worsen, fail to improve, or you develop other concerning symptoms, please notify our office or visit the local ER if we are unavailable. Symptoms including fever, severe pain, should not be ignored and should encourage call us.  Thank you for your visit to the [facility] today. If you have any questions or concerns please call us at [phone].

## 2018-08-23 DIAGNOSIS — N50811 Right testicular pain: Secondary | ICD-10-CM

## 2018-08-23 HISTORY — DX: Right testicular pain: N50.811

## 2018-08-23 LAB — MICROSCOPIC EXAMINATION
Casts: NONE SEEN /lpf
WBC, UA: >30 /hpf — AB (ref 0–5)

## 2018-08-23 LAB — URINALYSIS, COMPLETE
Bilirubin, UA: NEGATIVE
GLUCOSE, UA: NEGATIVE
Ketones, UA: NEGATIVE
Nitrite, UA: POSITIVE — AB
RBC, UA: NEGATIVE
Specific Gravity, UA: 1.016 (ref 1.005–1.030)
Urobilinogen, Ur: 0.2 mg/dL (ref 0.2–1.0)
pH, UA: 5 (ref 5.0–7.5)

## 2018-08-23 NOTE — Assessment & Plan Note (Addendum)
Right testicular pain: Ongoing for several months, intermittent, worse in the past few days, not associated with fever, chills, diet changes or meals, bowel movements, abdominal pain, urethral discharge, or dysuria. Denied sexual contact or intercourse in the past year.  Physical exam revealed a irregular south pole of the right testicle with enlarged diameter that was also acutely tender to palpation more so than the left testicle or remainder of the right. There was with general enlargement and higher riding of the right testicle as compared to the relatively unremarkable left testicle. The area did not transilluminate. There was no palpable bowel in the scrotum. There was mild erythema to the scrotum overlying the right testicle, no rash or other skin changes noted.    Differential included epididymitis of an inverted testicle, varicocele (palpation inconsistent with such), spermatocele cyst, and testicular cancer (unlikely give age) notably less likely a hydrocele or hernia but given his history I suppose these can not be excluded.  Patient has a history of prostate cancer and a surgically repaired right inguinal hernia and hydrocele.   Plan: Scrotal US ordered Ibuprofen 400mg  TID for 5-6 days Jock strap or similar device for support May warrant a trial of antibiotics for epididymitis if US unremarkable  Addendum: UA unremarkable for hematuria but indicative of possible epididymitis as it is nitrite positive with many bacteria and leukocyte negative UA's can occur in up to 33% of cases. Given the inferior/posterior nature of the testicular pain and UA findings I suspect that this is potentially even more likely to be epididymitis. As the recommendation to treat empirically while awaiting further evaluation is prevalent I feel it appropriate to order double strength Bactrim BID for ten days given his age (>35) and lack of sexual contacts. (avoiding flouroquinolones and CTX/doxy) Patient was  contacted and updated on the plan to which he agreed.

## 2018-08-23 NOTE — Assessment & Plan Note (Signed)
Hematuria: Will obtain a UA to better quantify and assess for resolution of the hematuria noted on UA in 05/2017 (no present on dipstick on 01/2018. Plan:  UA complete ordered

## 2018-08-24 MED ORDER — SULFAMETHOXAZOLE-TRIMETHOPRIM 800-160 MG PO TABS: 1.0000 | ORAL_TABLET | Freq: Two times a day (BID) | ORAL | 0 refills | Status: DC

## 2018-08-24 NOTE — Addendum Note (Signed)
Addended by: Nicola Girt on: 08/24/2018 11:10 AM   Modules accepted: Orders

## 2018-08-25 NOTE — Progress Notes (Signed)
Patient ID: [name], male   DOB: 1935/01/22, 83 y.o.   MRN: [id number]  I saw and evaluated the patient. I personally confirmed the key portions of Dr. Nelma Rothman history and exam and reviewed pertinent patient test results. The assessment, diagnosis, and plan were formulated together and I agree with the documentation in the resident's note.

## 2018-08-31 NOTE — Addendum Note (Signed)
Addended by: Nicola Girt on: 08/31/2018 01:06 PM   Modules accepted: Orders

## 2018-09-09 ENCOUNTER — Ambulatory Visit (HOSPITAL_COMMUNITY): Payer: Medicare Other

## 2018-09-15 ENCOUNTER — Ambulatory Visit (HOSPITAL_COMMUNITY)
Admission: RE | Admit: 2018-09-15 | Discharge: 2018-09-15 | Disposition: A | Discharge status: Home / Self Care | Payer: Medicare Other | Source: Ambulatory Visit | Attending: Internal Medicine | Admitting: Internal Medicine

## 2018-09-15 DIAGNOSIS — I861 Scrotal varices: Secondary | ICD-10-CM | POA: Diagnosis not present

## 2018-09-15 DIAGNOSIS — N50811 Right testicular pain: Secondary | ICD-10-CM

## 2018-09-15 DIAGNOSIS — N433 Hydrocele, unspecified: Secondary | ICD-10-CM | POA: Diagnosis not present

## 2018-09-21 ENCOUNTER — Telehealth: Payer: Self-pay | Admitting: Internal Medicine

## 2018-09-21 NOTE — Telephone Encounter (Signed)
Called patient to inquire as to the scrotal pain/tenderness and to inform him of the observation of a hydrocele and minor varicose veins within the scrotum. Based on symptoms of pain the hydrocele as a cause in conjunction with the lack of translucence of the hydrocele makes that an unlikely etiology and as such I will need to discuss his current symptoms with him.  Please notify the patient of this if he returns my call.   Kathi Ludwig, MD [facility] Internal Medicine, PGY-2

## 2018-11-14 ENCOUNTER — Ambulatory Visit (INDEPENDENT_AMBULATORY_CARE_PROVIDER_SITE_OTHER): Payer: Medicare Other | Admitting: Internal Medicine

## 2018-11-14 ENCOUNTER — Other Ambulatory Visit: Payer: Self-pay

## 2018-11-14 ENCOUNTER — Encounter (INDEPENDENT_AMBULATORY_CARE_PROVIDER_SITE_OTHER): Payer: Self-pay

## 2018-11-14 ENCOUNTER — Encounter: Payer: Self-pay | Admitting: Internal Medicine

## 2018-11-14 VITALS — BP 116/52 | HR 60 | Temp 98.0°F | Ht 71.5 in | Wt 166.7 lb

## 2018-11-14 DIAGNOSIS — Z23 Encounter for immunization: Secondary | ICD-10-CM

## 2018-11-14 DIAGNOSIS — I10 Essential (primary) hypertension: Secondary | ICD-10-CM | POA: Diagnosis not present

## 2018-11-14 DIAGNOSIS — Z0000 Encounter for general adult medical examination without abnormal findings: Secondary | ICD-10-CM | POA: Diagnosis not present

## 2018-11-14 DIAGNOSIS — E785 Hyperlipidemia, unspecified: Secondary | ICD-10-CM | POA: Diagnosis not present

## 2018-11-14 NOTE — Progress Notes (Signed)
   Subjective:   [name] is a 84 y.o. male who presents for a Medicare Annual Wellness Visit.  The following items have been reviewed and updated today in the appropriate area in the EMR.   Health Risk Assessment  Height, weight, BMI, and BP Visual acuity if needed Depression screen Fall risk / safety level Advance directive discussion Medical and family history were reviewed and updated Updating list of other providers & suppliers Medication reconciliation, including over the counter medicines Cognitive screen Written screening schedule Risk Factor list Personalized health advice, risky behaviors, and treatment advice  Current Social History 11/14/2018    Patient lives with Fiance in a home which is 1 story. There are 3 steps without handrails up to the entrance the patient uses. Patient states lack of handrails is not a problem for him.   Patient's method of transportation is personal car that fiance drives.  The highest level of education was 8th grade.  The patient retired from [employer].  Identified important Relationships are Fiance   Pets : None   Interests / Fun: watching sports on TV, walking   Current Stressors: "I'm not stressed about anything."   Religious / Personal Beliefs: Baptist   L. Ducatte, RN, BSN         Objective:    Vitals: BP (!) 116/52 (BP Location: Right Arm, Patient Position: Sitting, Cuff Size: Normal)   Pulse 60   Temp 98 F (36.7 C) (Oral)   Ht 5' 11.5" (1.816 m)   Wt 166 lb 11.2 oz (75.6 kg)   SpO2 100%   BMI 22.93 kg/m   Activities of Daily Living In your present state of health, do you have any difficulty performing the following activities: 11/14/2018 11/14/2018  Hearing? (No Data) N  Comment Patient denies -  Vision? - N  Difficulty concentrating or making decisions? - N  Walking or climbing stairs? - N  Comment - -  Dressing or bathing? - N  Doing errands, shopping? - Y  Some recent data might be hidden     Goals Goals    . Continue walking 1-1.5 hours per day 4-5 days per week    . Prevent falls       Fall Risk Fall Risk  11/14/2018 08/22/2018 08/03/2018 02/02/2018 05/12/2017  Falls in the past year? 0 No No No No   Depression Screening PHQ-9 score of 1  Cognitive Testing I assessed the patient for cognitive issues and the patient did  have issues with his / her cognition.  Mini-Cog  Passed with score 0/5   Assessment and Plan:    During the course of the visit the patient was educated and counseled about appropriate screening and preventive services as documented in the assessment and plan.  The printed AVS was given to the patient and included an updated screening schedule, a list of risk factors, and personalized health advice.        Velora Heckler, RN  11/14/2018

## 2018-11-14 NOTE — Patient Instructions (Addendum)
Annual Wellness Visit   Medicare Covered Preventative Screenings and Services  Services & Screenings Men and Women Who How Often Need? Date of Last Service Action  Abdominal Aortic Aneurysm Adults with AAA risk factors Once     Alcohol Misuse and Counseling All Adults Screening once a year if no alcohol misuse. Counseling up to 4 face to face sessions.     Bone Density Measurement  Adults at risk for osteoporosis Once every 2 yrs Yes    Lipid Panel Z13.6 All adults without CV disease Once every 5 yrs Yes    Colorectal Cancer   Stool sample or  Colonoscopy All adults 21 and older   Once every year  Every 10 years     Depression All Adults Once a year  Today  PHQ-9 = 1  Diabetes Screening Blood glucose, post glucose load, or GTT Z13.1  All adults at risk  Pre-diabetics  Once per year  Twice per year     Diabetes  Self-Management Training All adults Diabetics 10 hrs first year; 2 hours subsequent years. Requires Copay     Glaucoma  Diabetics  Family history of glaucoma  African Americans 58 yrs +  Hispanic Americans 6 yrs + Annually - requires coppay     Hepatitis C Z72.89 or F19.20  High Risk for HCV  Born between 1945 and 1965  Annually  Once     HIV Z11.4 All adults based on risk  Annually btw ages 18 & 64 regardless of risk  Annually > 65 yrs if at increased risk     Lung Cancer Screening Asymptomatic adults aged 53-77 with 30 pack yr history and current smoker OR quit within the last 15 yrs Annually Must have counseling and shared decision making documentation before first screen     Medical Nutrition Therapy Adults with   Diabetes  Renal disease  Kidney transplant within past 3 yrs 3 hours first year; 2 hours subsequent years     Obesity and Counseling All adults Screening once a year Counseling if BMI 30 or higher  Today  BMI ==22.93  Tobacco Use Counseling Adults who use tobacco  Up to 8 visits in one year     Vaccines Z23  Hepatitis  B  Influenza   Pneumonia  Adults   Once  Once every flu season  Two different vaccines separated by one year Yes Today Flu vaccine given in Nov 2019 at [facility] given today  Next Annual Wellness Visit People with Medicare Every year  Today     Services & Screenings Women Who How Often Need  Date of Last Service Action  Mammogram  Z12.31 Women over 65 One baseline ages 64-39. Annually ager 40 yrs+     Pap tests All women Annually if high risk. Every 2 yrs for normal risk women     Screening for cervical cancer with   Pap (Z01.419 nl or Z01.411abnl) &  HPV Z11.51 Women aged 53 to 25 Once every 5 yrs     Screening pelvic and breast exams All women Annually if high risk. Every 2 yrs for normal risk women     Sexually Transmitted Diseases  Chlamydia  Gonorrhea  Syphilis All at risk adults Annually for non pregnant females at increased risk         [place] Men Who How Ofter Need  Date of Last Service Action  Prostate Cancer - DRE & PSA Men over 50 Annually.  DRE might  require a copay.     Sexually Transmitted Diseases  Syphilis All at risk adults Annually for men at increased risk         Things That May Be Affecting Your Health:  Alcohol X Hearing loss  Pain    Depression  Home Safety  Sexual Health   Diabetes  Lack of physical activity  Stress   Difficulty with daily activities  Loneliness X Tiredness   Drug use  Medicines  Tobacco use   Falls  Motor Vehicle Safety  Weight   Food choices  Oral Health  Other    YOUR PERSONALIZED HEALTH PLAN : 1. Schedule your next subsequent Medicare Wellness visit in one year 2. Attend all of your regular appointments to address your medical issues 3. Complete the preventative screenings and services 4.Prevent falls 5. Keep up the good work with walking!!    Fall Prevention in the Home, Adult Falls can cause injuries. They can happen to people of all ages. There are many things you can do to  make your home safe and to help prevent falls. Ask for help when making these changes, if needed. What actions can I take to prevent falls? General Instructions  Use good lighting in all rooms. Replace any light bulbs that burn out.  Turn on the lights when you go into a dark area. Use night-lights.  Keep items that you use often in easy-to-reach places. Lower the shelves around your home if necessary.  Set up your furniture so you have a clear path. Avoid moving your furniture around.  Do not have throw rugs and other things on the floor that can make you trip.  Avoid walking on wet floors.  If any of your floors are uneven, fix them.  Add color or contrast paint or tape to clearly mark and help you see: ? Any grab bars or handrails. ? First and last steps of stairways. ? Where the edge of each step is.  If you use a stepladder: ? Make sure that it is fully opened. Do not climb a closed stepladder. ? Make sure that both sides of the stepladder are locked into place. ? Ask someone to hold the stepladder for you while you use it.  If there are any pets around you, be aware of where they are. What can I do in the bathroom?      Keep the floor dry. Clean up any water that spills onto the floor as soon as it happens.  Remove soap buildup in the tub or shower regularly.  Use non-skid mats or decals on the floor of the tub or shower.  Attach bath mats securely with double-sided, non-slip rug tape.  If you need to sit down in the shower, use a plastic, non-slip stool.  Install grab bars by the toilet and in the tub and shower. Do not use towel bars as grab bars. What can I do in the bedroom?  Make sure that you have a light by your bed that is easy to reach.  Do not use any sheets or blankets that are too big for your bed. They should not hang down onto the floor.  Have a firm chair that has side arms. You can use this for support while you get dressed. What can I do in  the kitchen?  Clean up any spills right away.  If you need to reach something above you, use a strong step stool that has a grab bar.  Keep electrical  cords out of the way.  Do not use floor polish or wax that makes floors slippery. If you must use wax, use non-skid floor wax. What can I do with my stairs?  Do not leave any items on the stairs.  Make sure that you have a light switch at the top of the stairs and the bottom of the stairs. If you do not have them, ask someone to add them for you.  Make sure that there are handrails on both sides of the stairs, and use them. Fix handrails that are broken or loose. Make sure that handrails are as long as the stairways.  Install non-slip stair treads on all stairs in your home.  Avoid having throw rugs at the top or bottom of the stairs. If you do have throw rugs, attach them to the floor with carpet tape.  Choose a carpet that does not hide the edge of the steps on the stairway.  Check any carpeting to make sure that it is firmly attached to the stairs. Fix any carpet that is loose or worn. What can I do on the outside of my home?  Use bright outdoor lighting.  Regularly fix the edges of walkways and driveways and fix any cracks.  Remove anything that might make you trip as you walk through a door, such as a raised step or threshold.  Trim any bushes or trees on the path to your home.  Regularly check to see if handrails are loose or broken. Make sure that both sides of any steps have handrails.  Install guardrails along the edges of any raised decks and porches.  Clear walking paths of anything that might make someone trip, such as tools or rocks.  Have any leaves, snow, or ice cleared regularly.  Use sand or salt on walking paths during winter.  Clean up any spills in your garage right away. This includes grease or oil spills. What other actions can I take?  Wear shoes that: ? Have a low heel. Do not wear high  heels. ? Have rubber bottoms. ? Are comfortable and fit you well. ? Are closed at the toe. Do not wear open-toe sandals.  Use tools that help you move around (mobility aids) if they are needed. These include: ? Canes. ? Walkers. ? Scooters. ? Crutches.  Review your medicines with your doctor. Some medicines can make you feel dizzy. This can increase your chance of falling. Ask your doctor what other things you can do to help prevent falls. Where to find more information  Centers for Disease Control and Prevention, STEADI: https://garcia.biz/  [employer] on Aging: BrainJudge.co.uk Contact a doctor if:  You are afraid of falling at home.  You feel weak, drowsy, or dizzy at home.  You fall at home. Summary  There are many simple things that you can do to make your home safe and to help prevent falls.  Ways to make your home safe include removing tripping hazards and installing grab bars in the bathroom.  Ask for help when making these changes in your home. This information is not intended to replace advice given to you by your health care provider. Make sure you discuss any questions you have with your health care provider. Document Released: 08/15/2009 Document Revised: 06/03/2017 Document Reviewed: 06/03/2017 Elsevier Interactive Patient Education  2019 [place] Maintenance, Male A healthy lifestyle and preventive care is important for your health and wellness. Ask your health care provider about what schedule  of regular examinations is right for you. What should I know about weight and diet? Eat a Healthy Diet  Eat plenty of vegetables, fruits, whole grains, low-fat dairy products, and lean protein.  Do not eat a lot of foods high in solid fats, added sugars, or salt.  Maintain a Healthy Weight Regular exercise can help you achieve or maintain a healthy weight. You should:  Do at least 150 minutes of exercise each week. The exercise  should increase your heart rate and make you sweat (moderate-intensity exercise).  Do strength-training exercises at least twice a week. Watch Your Levels of Cholesterol and Blood Lipids  Have your blood tested for lipids and cholesterol every 5 years starting at 82 years of age. If you are at high risk for heart disease, you should start having your blood tested when you are 82 years old. You may need to have your cholesterol levels checked more often if: ? Your lipid or cholesterol levels are high. ? You are older than 82 years of age. ? You are at high risk for heart disease. What should I know about cancer screening? Many types of cancers can be detected early and may often be prevented. Lung Cancer  You should be screened every year for lung cancer if: ? You are a current smoker who has smoked for at least 30 years. ? You are a former smoker who has quit within the past 15 years.  Talk to your health care provider about your screening options, when you should start screening, and how often you should be screened. Colorectal Cancer  Routine colorectal cancer screening usually begins at 82 years of age and should be repeated every 5-10 years until you are 82 years old. You may need to be screened more often if early forms of precancerous polyps or small growths are found. Your health care provider may recommend screening at an earlier age if you have risk factors for colon cancer.  Your health care provider may recommend using home test kits to check for hidden blood in the stool.  A small camera at the end of a tube can be used to examine your colon (sigmoidoscopy or colonoscopy). This checks for the earliest forms of colorectal cancer. Prostate and Testicular Cancer  Depending on your age and overall health, your health care provider may do certain tests to screen for prostate and testicular cancer.  Talk to your health care provider about any symptoms or concerns you have about  testicular or prostate cancer. Skin Cancer  Check your skin from head to toe regularly.  Tell your health care provider about any new moles or changes in moles, especially if: ? There is a change in a mole's size, shape, or color. ? You have a mole that is larger than a pencil eraser.  Always use sunscreen. Apply sunscreen liberally and repeat throughout the day.  Protect yourself by wearing long sleeves, pants, a wide-brimmed hat, and sunglasses when outside. What should I know about heart disease, diabetes, and high blood pressure?  If you are 24-61 years of age, have your blood pressure checked every 3-5 years. If you are 59 years of age or older, have your blood pressure checked every year. You should have your blood pressure measured twice-once when you are at a hospital or clinic, and once when you are not at a hospital or clinic. Record the average of the two measurements. To check your blood pressure when you are not at a hospital or clinic,  you can use: ? An automated blood pressure machine at a pharmacy. ? A home blood pressure monitor.  Talk to your health care provider about your target blood pressure.  If you are between 30-55 years old, ask your health care provider if you should take aspirin to prevent heart disease.  Have regular diabetes screenings by checking your fasting blood sugar level. ? If you are at a normal weight and have a low risk for diabetes, have this test once every three years after the age of 10. ? If you are overweight and have a high risk for diabetes, consider being tested at a younger age or more often.  A one-time screening for abdominal aortic aneurysm (AAA) by ultrasound is recommended for men aged 68-75 years who are current or former smokers. What should I know about preventing infection? Hepatitis B If you have a higher risk for hepatitis B, you should be screened for this virus. Talk with your health care provider to find out if you are at  risk for hepatitis B infection. Hepatitis C Blood testing is recommended for:  Everyone born from [birth year] through 1963.  Anyone with known risk factors for hepatitis C. Sexually Transmitted Diseases (STDs)  You should be screened each year for STDs including gonorrhea and chlamydia if: ? You are sexually active and are younger than 82 years of age. ? You are older than 82 years of age and your health care provider tells you that you are at risk for this type of infection. ? Your sexual activity has changed since you were last screened and you are at an increased risk for chlamydia or gonorrhea. Ask your health care provider if you are at risk.  Talk with your health care provider about whether you are at high risk of being infected with HIV. Your health care provider may recommend a prescription medicine to help prevent HIV infection. What else can I do?  Schedule regular health, dental, and eye exams.  Stay current with your vaccines (immunizations).  Do not use any tobacco products, such as cigarettes, chewing tobacco, and e-cigarettes. If you need help quitting, ask your health care provider.  Limit alcohol intake to no more than 2 drinks per day. One drink equals 12 ounces of beer, 5 ounces of wine, or 1 ounces of hard liquor.  Do not use street drugs.  Do not share needles.  Ask your health care provider for help if you need support or information about quitting drugs.  Tell your health care provider if you often feel depressed.  Tell your health care provider if you have ever been abused or do not feel safe at home. This information is not intended to replace advice given to you by your health care provider. Make sure you discuss any questions you have with your health care provider. Document Released: 04/16/2008 Document Revised: 06/17/2016 Document Reviewed: 07/23/2015 Elsevier Interactive Patient Education  2019 [employer].

## 2018-11-15 LAB — LIPID PANEL
Chol/HDL Ratio: 2.3 ratio (ref 0.0–5.0)
Cholesterol, Total: 190 mg/dL (ref 100–199)
HDL: 82 mg/dL (ref >39)
LDL Calculated: 88 mg/dL (ref 0–99)
Triglycerides: 101 mg/dL (ref 0–149)
VLDL Cholesterol Cal: 20 mg/dL (ref 5–40)

## 2018-11-15 NOTE — Progress Notes (Signed)
I agree with the assessment and plan by Myrna Blazer. We will review the lipid panel at his next visit.   Kathi Ludwig, MD [facility], PGY-2

## 2018-11-16 NOTE — Progress Notes (Signed)
Internal Medicine Clinic Attending  Case discussed with Dr. Harbrecht at the time of the visit.  We reviewed the resident's history and exam and pertinent patient test results.  I agree with the assessment, diagnosis, and plan of care documented in the resident's note.   

## 2018-11-24 ENCOUNTER — Telehealth: Payer: Self-pay | Admitting: Internal Medicine

## 2019-01-31 ENCOUNTER — Other Ambulatory Visit: Payer: Self-pay | Admitting: Internal Medicine

## 2019-01-31 DIAGNOSIS — I10 Essential (primary) hypertension: Secondary | ICD-10-CM

## 2019-02-01 ENCOUNTER — Encounter: Payer: Medicare Other | Admitting: Internal Medicine

## 2019-02-06 ENCOUNTER — Other Ambulatory Visit: Payer: Self-pay | Admitting: Internal Medicine

## 2019-02-06 DIAGNOSIS — E785 Hyperlipidemia, unspecified: Secondary | ICD-10-CM

## 2019-03-22 ENCOUNTER — Encounter: Payer: Medicare Other | Admitting: Internal Medicine

## 2019-03-22 ENCOUNTER — Other Ambulatory Visit: Payer: Self-pay

## 2019-08-07 ENCOUNTER — Other Ambulatory Visit: Payer: Self-pay | Admitting: Internal Medicine

## 2019-08-07 DIAGNOSIS — I10 Essential (primary) hypertension: Secondary | ICD-10-CM

## 2019-09-22 ENCOUNTER — Encounter: Payer: Medicare Other | Admitting: Internal Medicine

## 2019-11-01 ENCOUNTER — Other Ambulatory Visit: Payer: Self-pay | Admitting: Internal Medicine

## 2019-11-01 DIAGNOSIS — I10 Essential (primary) hypertension: Secondary | ICD-10-CM

## 2019-11-01 DIAGNOSIS — E785 Hyperlipidemia, unspecified: Secondary | ICD-10-CM

## 2019-11-02 ENCOUNTER — Ambulatory Visit: Payer: Medicare Other | Attending: Internal Medicine

## 2019-11-02 DIAGNOSIS — Z20822 Contact with and (suspected) exposure to covid-19: Secondary | ICD-10-CM

## 2019-11-03 LAB — NOVEL CORONAVIRUS, NAA: SARS-CoV-2, NAA: DETECTED — AB

## 2019-11-06 ENCOUNTER — Encounter (HOSPITAL_COMMUNITY): Payer: Self-pay | Admitting: Emergency Medicine

## 2019-11-06 ENCOUNTER — Other Ambulatory Visit: Payer: Self-pay

## 2019-11-06 ENCOUNTER — Telehealth: Payer: Self-pay | Admitting: Internal Medicine

## 2019-11-06 ENCOUNTER — Ambulatory Visit: Payer: Medicare Other | Admitting: Internal Medicine

## 2019-11-06 ENCOUNTER — Emergency Department (HOSPITAL_COMMUNITY)
Admission: EM | Admit: 2019-11-06 | Discharge: 2019-11-06 | Disposition: A | Discharge status: Home / Self Care | Payer: Medicare HMO | Attending: Emergency Medicine | Admitting: Emergency Medicine

## 2019-11-06 DIAGNOSIS — Z87891 Personal history of nicotine dependence: Secondary | ICD-10-CM | POA: Diagnosis not present

## 2019-11-06 DIAGNOSIS — R630 Anorexia: Secondary | ICD-10-CM | POA: Diagnosis not present

## 2019-11-06 DIAGNOSIS — N183 Chronic kidney disease, stage 3 unspecified: Secondary | ICD-10-CM | POA: Insufficient documentation

## 2019-11-06 DIAGNOSIS — Z8546 Personal history of malignant neoplasm of prostate: Secondary | ICD-10-CM | POA: Diagnosis not present

## 2019-11-06 DIAGNOSIS — Z79899 Other long term (current) drug therapy: Secondary | ICD-10-CM | POA: Diagnosis not present

## 2019-11-06 DIAGNOSIS — R5383 Other fatigue: Secondary | ICD-10-CM | POA: Insufficient documentation

## 2019-11-06 DIAGNOSIS — I129 Hypertensive chronic kidney disease with stage 1 through stage 4 chronic kidney disease, or unspecified chronic kidney disease: Secondary | ICD-10-CM | POA: Diagnosis not present

## 2019-11-06 DIAGNOSIS — U071 COVID-19: Secondary | ICD-10-CM | POA: Insufficient documentation

## 2019-11-06 DIAGNOSIS — R531 Weakness: Secondary | ICD-10-CM | POA: Diagnosis not present

## 2019-11-06 LAB — URINALYSIS, ROUTINE W REFLEX MICROSCOPIC
Bilirubin Urine: NEGATIVE
Glucose, UA: NEGATIVE mg/dL
Hgb urine dipstick: NEGATIVE
Ketones, ur: 5 mg/dL — AB
Leukocytes,Ua: NEGATIVE
Nitrite: NEGATIVE
Protein, ur: 30 mg/dL — AB
Specific Gravity, Urine: 1.016 (ref 1.005–1.030)
pH: 6 (ref 5.0–8.0)

## 2019-11-06 LAB — BASIC METABOLIC PANEL
Anion gap: 9 (ref 5–15)
BUN: 28 mg/dL — ABNORMAL HIGH (ref 8–23)
CO2: 25 mmol/L (ref 22–32)
Calcium: 8.4 mg/dL — ABNORMAL LOW (ref 8.9–10.3)
Chloride: 104 mmol/L (ref 98–111)
Creatinine, Ser: 1.76 mg/dL — ABNORMAL HIGH (ref 0.61–1.24)
GFR calc Af Amer: 41 mL/min — ABNORMAL LOW (ref >60)
GFR calc non Af Amer: 35 mL/min — ABNORMAL LOW (ref >60)
Glucose, Bld: 100 mg/dL — ABNORMAL HIGH (ref 70–99)
Potassium: 4.4 mmol/L (ref 3.5–5.1)
Sodium: 138 mmol/L (ref 135–145)

## 2019-11-06 LAB — CBC
HCT: 38.6 % — ABNORMAL LOW (ref 39.0–52.0)
Hemoglobin: 12.3 g/dL — ABNORMAL LOW (ref 13.0–17.0)
MCH: 31.4 pg (ref 26.0–34.0)
MCHC: 31.9 g/dL (ref 30.0–36.0)
MCV: 98.5 fL (ref 80.0–100.0)
Platelets: 151 10*3/uL (ref 150–400)
RBC: 3.92 MIL/uL — ABNORMAL LOW (ref 4.22–5.81)
RDW: 12.5 % (ref 11.5–15.5)
WBC: 2.9 10*3/uL — ABNORMAL LOW (ref 4.0–10.5)
nRBC: 0 % (ref 0.0–0.2)

## 2019-11-06 LAB — CBG MONITORING, ED: Glucose-Capillary: 91 mg/dL (ref 70–99)

## 2019-11-06 MED ORDER — SODIUM CHLORIDE 0.9% FLUSH: 3.0000 mL | Freq: Once | INTRAVENOUS | Status: DC

## 2019-11-06 MED ORDER — SODIUM CHLORIDE 0.9 % IV BOLUS
1000.0000 mL | Freq: Once | INTRAVENOUS | Status: AC
  Administered 2019-11-06: 1000 mL via INTRAVENOUS

## 2019-11-06 NOTE — ED Notes (Signed)
Please  Call cherl patrick at [phone]

## 2019-11-06 NOTE — ED Notes (Signed)
Pt ambulates for SpO2 check. Remains >97%. No complaints or concerns.

## 2019-11-06 NOTE — ED Notes (Signed)
Pt's family notified on pt's being discharged.  St's she will be on her way to pick him up.

## 2019-11-06 NOTE — ED Provider Notes (Addendum)
1925: Pt handed off to me by previous EDPA Lavone Orn and Steinl.   Please see previous note for full HP  85 yo M recently diagnosed with COVID presents from home for ongoing weakness. Called PCP who advised ER evaluation.   Per previous team pt denies localizing symptoms of worsening infection, just weak.  Initial hypotension resolved without interventions.  Previous team contacted wife and PCP.    Plan for IVF, reassessment, anticipate discharge. UA, EKG, IVF pending at hand off.  Physical Exam  BP (!) 167/77   Pulse 72   Temp 98.8 F (37.1 C) (Oral)   Resp 16   SpO2 97%   Physical Exam Vitals and nursing note reviewed.  Constitutional:      Appearance: He is well-developed.     Comments: NAD.  HENT:     Head: Normocephalic and atraumatic.     Right Ear: External ear normal.     Left Ear: External ear normal.     Nose: Nose normal.  Eyes:     Conjunctiva/sclera: Conjunctivae normal.  Cardiovascular:     Rate and Rhythm: Normal rate and regular rhythm.     Heart sounds: Normal heart sounds.  Pulmonary:     Effort: Pulmonary effort is normal.     Breath sounds: Normal breath sounds.  Abdominal:     Palpations: Abdomen is soft.     Tenderness: There is no abdominal tenderness.  Musculoskeletal:        General: Normal range of motion.     Cervical back: Normal range of motion and neck supple.  Skin:    General: Skin is warm and dry.     Capillary Refill: Capillary refill takes less than 2 seconds.  Neurological:     Mental Status: He is alert and oriented to person, place, and time.  Psychiatric:        Behavior: Behavior normal.        Thought Content: Thought content normal.        Judgment: Judgment normal.     ED Course/Procedures   Clinical Course as of Nov 05 2030  Mon Nov 06, 2019  2031 Mildly elevated from previous   Creatinine(!): 1.76 [CG]  2031 Ketones, ur(!): 5 [CG]  2032 Hemoglobin(!): 12.3 [CG]  2032 WBC(!): 2.9 [CG]  2032 Sinus rhythm Left  anterior fascicular block Anterior infarct, old Confirmed by Quintella Reichert [phone]) on 11/06/2019 5:27:39 PM  ED EKG [CG]    Clinical Course User Index [CG] Kinnie Feil, PA-C    Procedures  MDM   2033: Pt re-evaluated.  Hypertensive in setting of h/o hypertension. Afebrile, VS normal. Clinically well appearing.  Has ambulated here without hypoxia or difficulty. Tolerating PO.  ER work up personally reviewed.  Mild elevation of creatinine with ketonuria suspect from dehydration/decreased PO intake.  CBC reflect COVID illness.  EKG non ischemic without arrhythmias.    Pt has received 1 L IVF.  Feels better.  He denies CP, SOB, palpitations, light headedness, syncope, vomiting, diarrhea but states he just feels "weak".  He has good follow up. Will dc now with supportive care. Encouraged inreased PO intake and PCP f/u in 2 days for clinical re-evaluation.  Recommended holding lisinopril due to mild elevation in creatinine, resume in 3 days.  Return precautions given. Discussed with EDP Ralene Bathe. Will message PCP to ensure close f/u.       Arlean Hopping 11/06/19 2038    Quintella Reichert, MD 11/09/19 [phone]
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
## 2019-11-06 NOTE — Discharge Instructions (Addendum)
You were seen in the ER for weakness  Labs and EKG were overall reassuring  We suspect you are dehydrated  Increase oral fluids and intake, rest.  Call your doctor and set up an appointment for re-evaluation in the office in 48 hours.    Return for worsening symptoms, chest pain, shortness of breath, passing out, persistent or severe vomiting or diarrhea  Please hold and do not take your blood pressure medicine (lisinopril) for the next 2 days, resume on day 3. Discuss this with your primary care doctor at your next visit.

## 2019-11-06 NOTE — ED Triage Notes (Signed)
Pt being sent for evaluation of increasing malaise after being dx with Covid on 12/31. See internal med note. Pt a/o x4 slow gait with generalized weakness.

## 2019-11-06 NOTE — Progress Notes (Signed)
Patient's wife call then to discuss the patient's increased somnolence. Last Wednesday he tested positive for COVID-19. Since that time he has not been eating or drinking very much. He has began to experience dizziness, fatigue, and headaches. Denies fevers, shortness of breath, diarrhea. He is not been using any over-the-counter medications. He has been taking all his prescription medications as prescribed including his lisinopril.  I reviewed his baseline labs. He has CKD stage III and hypertension.   Given his increased somnolence in the setting of using lisinopril and decreased PO intake I advised coming in to be evaluated. Patient's wife voices understanding and will bring him to the emergency department.

## 2019-11-06 NOTE — ED Notes (Signed)
Orpah Clinton wife [phone] looking for an update on the patient

## 2019-11-06 NOTE — ED Notes (Signed)
Spoke with pt's wife via telephone and updated on pt's condition and plan of care.  St's she will call back in approx 1min for another update

## 2019-11-07 NOTE — Progress Notes (Signed)
Internal Medicine Clinic Attending  Case discussed with Dr. Tarri Abernethy soon after the resident saw the patient.  We reviewed the resident's history and pertinent patient test results.  I agree with the assessment, diagnosis, and plan of care documented in the resident's note.

## 2019-12-11 ENCOUNTER — Other Ambulatory Visit: Payer: Self-pay | Admitting: Internal Medicine

## 2019-12-11 DIAGNOSIS — E785 Hyperlipidemia, unspecified: Secondary | ICD-10-CM

## 2019-12-13 ENCOUNTER — Other Ambulatory Visit: Payer: Self-pay | Admitting: Internal Medicine

## 2019-12-13 DIAGNOSIS — I10 Essential (primary) hypertension: Secondary | ICD-10-CM

## 2019-12-29 ENCOUNTER — Ambulatory Visit: Payer: Medicare HMO | Attending: Internal Medicine

## 2019-12-29 DIAGNOSIS — Z23 Encounter for immunization: Secondary | ICD-10-CM | POA: Insufficient documentation

## 2019-12-29 NOTE — Progress Notes (Signed)
   Covid-19 Vaccination Clinic  Name:  [name]    MRN: [id number] DOB: 09/28/1935  12/29/2019  Mr. [name] was observed post Covid-19 immunization for 30 minutes based on pre-vaccination screening without incidence. He was provided with Vaccine Information Sheet and instruction to access the V-Safe system.   Mr. [name] was instructed to call 911 with any severe reactions post vaccine: Marland Kitchen Difficulty breathing  . Swelling of your face and throat  . A fast heartbeat  . A bad rash all over your body  . Dizziness and weakness    Immunizations Administered    Name Date Dose VIS Date Route   Pfizer COVID-19 Vaccine 12/29/2019  2:49 PM 0.3 mL 10/13/2019 Intramuscular   Manufacturer: [place]   Lot: [id number]   [place]: [id number]
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
## 2020-01-10 ENCOUNTER — Encounter: Payer: Self-pay | Admitting: Internal Medicine

## 2020-01-10 ENCOUNTER — Ambulatory Visit (INDEPENDENT_AMBULATORY_CARE_PROVIDER_SITE_OTHER): Payer: Medicare HMO | Admitting: Internal Medicine

## 2020-01-10 VITALS — BP 132/58 | HR 64 | Temp 98.4°F | Ht 71.0 in | Wt 159.0 lb

## 2020-01-10 DIAGNOSIS — M25562 Pain in left knee: Secondary | ICD-10-CM

## 2020-01-10 DIAGNOSIS — Z79899 Other long term (current) drug therapy: Secondary | ICD-10-CM | POA: Diagnosis not present

## 2020-01-10 DIAGNOSIS — Z299 Encounter for prophylactic measures, unspecified: Secondary | ICD-10-CM

## 2020-01-10 DIAGNOSIS — I10 Essential (primary) hypertension: Secondary | ICD-10-CM | POA: Diagnosis not present

## 2020-01-10 DIAGNOSIS — B351 Tinea unguium: Secondary | ICD-10-CM

## 2020-01-10 DIAGNOSIS — G8929 Other chronic pain: Secondary | ICD-10-CM

## 2020-01-10 DIAGNOSIS — E785 Hyperlipidemia, unspecified: Secondary | ICD-10-CM

## 2020-01-10 DIAGNOSIS — M1712 Unilateral primary osteoarthritis, left knee: Secondary | ICD-10-CM | POA: Insufficient documentation

## 2020-01-10 DIAGNOSIS — M7989 Other specified soft tissue disorders: Secondary | ICD-10-CM | POA: Diagnosis not present

## 2020-01-10 DIAGNOSIS — R0600 Dyspnea, unspecified: Secondary | ICD-10-CM

## 2020-01-10 DIAGNOSIS — E7800 Pure hypercholesterolemia, unspecified: Secondary | ICD-10-CM

## 2020-01-10 LAB — BASIC METABOLIC PANEL
Anion gap: 7 (ref 5–15)
BUN: 17 mg/dL (ref 8–23)
CO2: 25 mmol/L (ref 22–32)
Calcium: 8.8 mg/dL — ABNORMAL LOW (ref 8.9–10.3)
Chloride: 109 mmol/L (ref 98–111)
Creatinine, Ser: 1.27 mg/dL — ABNORMAL HIGH (ref 0.61–1.24)
GFR calc Af Amer: >60 mL/min (ref >60)
GFR calc non Af Amer: 52 mL/min — ABNORMAL LOW (ref >60)
Glucose, Bld: 103 mg/dL — ABNORMAL HIGH (ref 70–99)
Potassium: 4.4 mmol/L (ref 3.5–5.1)
Sodium: 141 mmol/L (ref 135–145)

## 2020-01-10 LAB — D-DIMER, QUANTITATIVE: D-Dimer, Quant: 0.74 ug/mL-FEU — ABNORMAL HIGH (ref 0.00–0.50)

## 2020-01-10 NOTE — Assessment & Plan Note (Addendum)
Left ankle and knee greater than the right but left calf of equal size. Minimal pain in the left knee area with a trivial/small sized effusion noted on bedside US. Low suspicion for DVT with Well DVT study score of 1. I feel that the swelling is most likely from his Amlodipine, renal failure vs heart failure and will treat this as elsewhere.  Plan: High sensitivity D-dimer today and if <1.0 no need for DVT US study

## 2020-01-10 NOTE — Patient Instructions (Signed)
FOLLOW-UP INSTRUCTIONS When: 4 wks For: Follow-up of your Echo and BP What to bring: All of your medications  For your leg swelling I recommend that we get a lab today and if the level is high we will order a ultrasound of the leg as well. Meanwhile, we will order an ultrasound of the heart due to your shortness of breath and swollen legs even though the left is greater than the right.   For your high blood pressure we will stop the Amlodipine 10mg  due to the swelling. I will call you when the lab comes back and let you know if we can make any medication changes to improve your blood pressure based on your kidney function.   As always if your symptoms worsen, fail to improve, or you develop other concerning symptoms, please notify our office or visit the local ER if we are unavailable. Symptoms including shortness of breath, chest pain, swelling in the leg that is worse, should not be ignored and should encourage you to visit the ED if we are unavailable by phone or the symptoms are severe.  Thank you for your visit to the [facility] today. If you have any questions or concerns please call us at [phone].

## 2020-01-10 NOTE — Assessment & Plan Note (Addendum)
Mild, improves with rest. Able to perform ADL's with minimal symptoms. Walks daily but progressively less now. Denied orthopnea but has increased snoring while lying flat per spouse. No JVD on exam or prominent murmur auscultated by this examiner today. Aside from his pedal edema and occasional dyspnea I have little to point to heart failure but do feel that he has deconditioned out of proportion to normal aging and that a noninvasive test is appropriate.  Plan: Echo complete ordered BMP ordered

## 2020-01-10 NOTE — Assessment & Plan Note (Addendum)
HLD: Patient requested to remain on Statin therapy.  Continue Pravastatin 20mg  daily, refill previously provided

## 2020-01-10 NOTE — Progress Notes (Signed)
Internal Medicine Clinic Attending  Case discussed with Dr. Harbrecht at the time of the visit.  We reviewed the resident's history and exam and pertinent patient test results.  I agree with the assessment, diagnosis, and plan of care documented in the resident's note.   

## 2020-01-10 NOTE — Assessment & Plan Note (Signed)
Has completed colon cancer screening.

## 2020-01-10 NOTE — Assessment & Plan Note (Signed)
Chronic. But worse the past several months. Small effusion. Likely inflammatory. Osteoarthritis most likely given his right knee symptoms. The knee does no appear to be acutely infected, it is not red, swollen or acutely tender. Patient prefers to forgo potential injection/aspiration today to which I am amenable given the lack of signs indicating potential septic arthritis.

## 2020-01-10 NOTE — Assessment & Plan Note (Signed)
  Hypertension: Patient's BP today is 132/58 with a goal of <140/80. The patient endorses adherence to his medication regimen. He denied, chest pain, headache, visual changes, lightheadedness, weakness, dizziness on standing. He endorses swelling in the feet and ankles greater on the left than the right. His last sCr had increased to >1.7 and as such warranted a repeat prior to adding additional antihypertensive medicines today.   Plan: Continue lisinopril 10mg  daily Discontinue Amlodipine 10mg  due to swelling in his feet BMP to assess renal function

## 2020-01-10 NOTE — Assessment & Plan Note (Signed)
Patient today demonstrating progressive onychomycosis more so of the right great toe than the left but present on all toes. Given the severity he may need debridement vs oral therapy for which he will most certainly be best served by podiatry. Given the progress nature of the right great toe I feel he will need this sooner than later and have placed the referral urgently.

## 2020-01-11 ENCOUNTER — Telehealth: Payer: Self-pay | Admitting: Internal Medicine

## 2020-01-11 DIAGNOSIS — I10 Essential (primary) hypertension: Secondary | ICD-10-CM

## 2020-01-11 MED ORDER — LOSARTAN POTASSIUM-HCTZ 50-12.5 MG PO TABS: 1.0000 | ORAL_TABLET | Freq: Every day | ORAL | 1 refills | Status: DC

## 2020-01-11 NOTE — Telephone Encounter (Signed)
I called to notify the patient of his lab results and plan of care changes. There was no answer. He is to proceed with his Echo and to stop his Amlodipine as we discussed. Additionally, he is to stop his Lisinopril today as well.   As his renal function improved to 1.27 from 1.76 we will stop his lisinopril and start a combo HCTZ-ARB today. Repeat BMP in one month is recommended for which he is already scheduled.   The patients D-dimer was elevated to 0.74 but when adjusted for age this is not consider significant with a Wells score of 0-1 and a low likelihood of DVT based on my medical opinion.   Kathi Ludwig, MD [facility] Internal Medicine, PGY-3

## 2020-01-23 ENCOUNTER — Other Ambulatory Visit: Payer: Self-pay

## 2020-01-23 ENCOUNTER — Encounter: Payer: Self-pay | Admitting: Podiatry

## 2020-01-23 ENCOUNTER — Ambulatory Visit (INDEPENDENT_AMBULATORY_CARE_PROVIDER_SITE_OTHER): Payer: Medicare HMO | Admitting: Podiatry

## 2020-01-23 DIAGNOSIS — B351 Tinea unguium: Secondary | ICD-10-CM | POA: Diagnosis not present

## 2020-01-23 DIAGNOSIS — M79676 Pain in unspecified toe(s): Secondary | ICD-10-CM

## 2020-01-23 DIAGNOSIS — L03031 Cellulitis of right toe: Secondary | ICD-10-CM | POA: Insufficient documentation

## 2020-01-23 NOTE — Progress Notes (Signed)
This patient presents  to the office for evaluation and treatment of long thick painful nails both feet. .  This patient is unable to trim his own nails since the patient cannot reach the feet.  Patient says the nails are painful walking and wearing his shoes. He says his big toenail right foot is broken down without any drainage or infection noted.[name]   He presents  for preventive foot care services.  General Appearance  Alert, conversant and in no acute stress.  Vascular  Dorsalis pedis and posterior tibial  pulses are palpable right foot.  Dorsalis pedis and posterior tibial pulses are absent left foot.  .  Capillary return is within normal limits  bilaterally. Temperature is within normal limits  bilaterally.  Neurologic  Senn-Weinstein monofilament wire test diminished   bilaterally. Muscle power within normal limits bilaterally.  Nails Thick disfigured discolored nails with subungual debris  from hallux to fifth toes bilaterally. No evidence of bacterial infection or drainage bilaterally. Fragmented disfigured right hallux.    Orthopedic  No limitations of motion  feet .  No crepitus or effusions noted.  No bony pathology or digital deformities noted.  HAV  B/L.    Skin  normotropic skin with no porokeratosis noted bilaterally.  No signs of infections or ulcers noted.     Onychomycosis  Pain in toes right foot  Pain in toes left foot  Paronychia right hallux  Debridement  of nails  1-5  B/L with a nail nipper.  Nails were then filed using a dremel tool with no incidents.   Upon debriding the right hallux nail,  The nail fragments were debrided exposing the nail bed.  There is chronic granulation tissue medial border right hallux.  The nail was removed from right hallux exposing nail bed.  This site was then bandaged with neosporin/DSD.  Home instructions given to patient.  He is to return  the office in one week.  Prior to debriding the skin, this nail gave the appearance of verrucous tissue.   Patient will be reevaluated in one week.  Call the office if this nail worsens.   Gardiner Barefoot DPM

## 2020-01-23 NOTE — Patient Instructions (Signed)

## 2020-01-24 ENCOUNTER — Ambulatory Visit: Payer: Medicare HMO | Attending: Internal Medicine

## 2020-01-24 DIAGNOSIS — Z23 Encounter for immunization: Secondary | ICD-10-CM

## 2020-01-24 NOTE — Progress Notes (Signed)
   Covid-19 Vaccination Clinic  Name:  [name]    MRN: [id number] DOB: 05-09-1935  01/24/2020  Mr. [name] was observed post Covid-19 immunization for 15 minutes without incident. He was provided with Vaccine Information Sheet and instruction to access the V-Safe system.   Mr. [name] was instructed to call 911 with any severe reactions post vaccine: Marland Kitchen Difficulty breathing  . Swelling of face and throat  . A fast heartbeat  . A bad rash all over body  . Dizziness and weakness   Immunizations Administered    Name Date Dose VIS Date Route   Pfizer COVID-19 Vaccine 01/24/2020  2:50 PM 0.3 mL 10/13/2019 Intramuscular   Manufacturer: [place]   Lot: [id number]   [place]: [id number]

## 2020-01-31 ENCOUNTER — Encounter: Payer: Self-pay | Admitting: Podiatry

## 2020-01-31 ENCOUNTER — Ambulatory Visit (INDEPENDENT_AMBULATORY_CARE_PROVIDER_SITE_OTHER): Payer: Medicare HMO | Admitting: Podiatry

## 2020-01-31 ENCOUNTER — Other Ambulatory Visit: Payer: Self-pay

## 2020-01-31 VITALS — Temp 97.8°F

## 2020-01-31 DIAGNOSIS — L03031 Cellulitis of right toe: Secondary | ICD-10-CM

## 2020-01-31 DIAGNOSIS — B351 Tinea unguium: Secondary | ICD-10-CM | POA: Diagnosis not present

## 2020-01-31 NOTE — Progress Notes (Signed)
This patient presents  to the office for evaluation and treatment of paronychia right great toe.  His nails were debrided last week and patient was told to soak his great toe at home.  He presents to the office today saying his right big toe is less painful today.  He says the toe is healing.  He presents to the office for further evaluation and treatment.  General Appearance  Alert, conversant and in no acute stress.  Vascular  Dorsalis pedis and posterior tibial  pulses are palpable right foot.  Dorsalis pedis and posterior tibial pulses are absent left foot.  .  Capillary return is within normal limits  bilaterally. Temperature is within normal limits  bilaterally.  Neurologic  Senn-Weinstein monofilament wire test diminished   bilaterally. Muscle power within normal limits bilaterally.  Nails Right great toe has crusty  tissue noted on nail bed .  The nail bed is still soft with crusty tissue noted right hallux.  Orthopedic  No limitations of motion  feet .  No crepitus or effusions noted.  No bony pathology or digital deformities noted.  HAV  B/L.    Skin  normotropic skin with no porokeratosis noted bilaterally.  No signs of infections or ulcers noted.        Paronychia right hallux  ROV.  Used dremel tool to smooth the crusty tissue right nail bed.  Neosporin/DSD  RTC 10 weeks.     Gardiner Barefoot DPM

## 2020-02-08 ENCOUNTER — Other Ambulatory Visit: Payer: Self-pay

## 2020-02-08 ENCOUNTER — Ambulatory Visit (HOSPITAL_COMMUNITY): Payer: Medicare HMO | Attending: Cardiology

## 2020-02-08 DIAGNOSIS — R0600 Dyspnea, unspecified: Secondary | ICD-10-CM | POA: Diagnosis not present

## 2020-03-06 ENCOUNTER — Encounter: Payer: Self-pay | Admitting: *Deleted

## 2020-03-06 NOTE — Progress Notes (Signed)

## 2020-03-08 ENCOUNTER — Ambulatory Visit (INDEPENDENT_AMBULATORY_CARE_PROVIDER_SITE_OTHER): Payer: Medicare HMO | Admitting: Internal Medicine

## 2020-03-08 ENCOUNTER — Other Ambulatory Visit: Payer: Self-pay

## 2020-03-08 DIAGNOSIS — M1711 Unilateral primary osteoarthritis, right knee: Secondary | ICD-10-CM | POA: Diagnosis not present

## 2020-03-08 DIAGNOSIS — M25562 Pain in left knee: Secondary | ICD-10-CM

## 2020-03-08 MED ORDER — DICLOFENAC SODIUM 1 % EX GEL: 2.0000 g | Freq: Four times a day (QID) | CUTANEOUS | 2 refills | Status: AC

## 2020-03-08 NOTE — Assessment & Plan Note (Signed)
Patient and wife called for concerns of persistent left knee pain. Notes to have progressively worsening edema surround left knee and pain unchanged from prior exam. He denies any trauma or erythema in the area. He notes trying a knee brace previously without significant relief.  Review of PCP's notes elected for monitoring of symptoms given lack of signs of potential septic arthritis.  Given current concerns, likely still inflammatory, most likely osteoarthritis. Will try voltaren gel at this time. Patient instructed to seek urgent medical care if worsening pain, swelling or erythema. He expresses understanding.
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
## 2020-03-13 NOTE — Progress Notes (Signed)
Things That May Be Affecting Your Health:  Alcohol  Hearing loss  Pain    Depression  Home Safety  Sexual Health   Diabetes  Lack of physical activity  Stress  Y Difficulty with daily activities  Loneliness  Tiredness   Drug use  Medicines  Tobacco use  Y Falls  Motor Vehicle Safety  Weight  Y Food choices  Oral Health  Other    YOUR PERSONALIZED HEALTH PLAN : 1. Schedule your next subsequent Medicare Wellness visit in one year 2. Attend all of your regular appointments to address your medical issues 3. Complete the preventative screenings and services   Annual Wellness Visit   Medicare Covered Preventative Screenings and [place] Men and Women Who How Often Need? Date of Last Service Action  Abdominal Aortic Aneurysm Adults with AAA risk factors Once     Alcohol Misuse and Counseling All Adults Screening once a year if no alcohol misuse. Counseling up to 4 face to face sessions.     Bone Density Measurement  Adults at risk for osteoporosis Once every 2 yrs     Lipid Panel Z13.6 All adults without CV disease Once every 5 yrs     Colorectal Cancer   Stool sample or  Colonoscopy All adults 65 and older   Once every year  Every 10 years     Depression All Adults Once a year  Today   Diabetes Screening Blood glucose, post glucose load, or GTT Z13.1  All adults at risk  Pre-diabetics  Once per year  Twice per year     Diabetes  Self-Management Training All adults Diabetics 10 hrs first year; 2 hours subsequent years. Requires Copay     Glaucoma  Diabetics  Family history of glaucoma  African Americans 43 yrs +  Hispanic Americans 52 yrs + Annually - requires coppay     Hepatitis C Z72.89 or F19.20  High Risk for HCV  Born between 1945 and 1965  Annually  Once     HIV Z11.4 All adults based on risk  Annually btw ages 86 & 10 regardless of risk  Annually > 65 yrs if at increased risk     Lung Cancer Screening Asymptomatic adults aged  64-77 with 30 pack yr history and current smoker OR quit within the last 15 yrs Annually Must have counseling and shared decision making documentation before first screen     Medical Nutrition Therapy Adults with   Diabetes  Renal disease  Kidney transplant within past 3 yrs 3 hours first year; 2 hours subsequent years     Obesity and Counseling All adults Screening once a year Counseling if BMI 30 or higher  Today   Tobacco Use Counseling Adults who use tobacco  Up to 8 visits in one year Y    Vaccines Z23  Hepatitis B  Influenza   Pneumonia  Adults   Once  Once every flu season  Two different vaccines separated by one year Y    Next Annual Wellness Visit People with Medicare Every year Y [place] Women Who How Often Need  Date of Last Service Action  Mammogram  Z12.31 Women over 69 One baseline ages 18-39. Annually ager 40 yrs+     Pap tests All women Annually if high risk. Every 2 yrs for normal risk women     Screening for cervical cancer with   Pap (Z01.419 nl or Z01.411abnl) &  HPV Z11.51 Women aged 51 to 43 Once every 5 yrs     Screening pelvic and breast exams All women Annually if high risk. Every 2 yrs for normal risk women     Sexually Transmitted Diseases  Chlamydia  Gonorrhea  Syphilis All at risk adults Annually for non pregnant females at increased risk         [place] Men Who How Ofter Need  Date of Last Service Action  Prostate Cancer - DRE & PSA Men over 50 Annually.  DRE might require a copay.     Sexually Transmitted Diseases  Syphilis All at risk adults Annually for men at increased risk

## 2020-04-02 NOTE — Progress Notes (Signed)
Internal Medicine Clinic Attending  Case discussed with Dr. Aslam at the time of the visit.  We reviewed the resident's history and exam and pertinent patient test results.  I agree with the assessment, diagnosis, and plan of care documented in the resident's note.  

## 2020-04-26 ENCOUNTER — Encounter: Payer: Self-pay | Admitting: *Deleted

## 2020-05-01 ENCOUNTER — Ambulatory Visit: Payer: Medicare HMO | Admitting: Podiatry

## 2020-05-20 ENCOUNTER — Other Ambulatory Visit: Payer: Self-pay | Admitting: Student

## 2020-05-20 DIAGNOSIS — E785 Hyperlipidemia, unspecified: Secondary | ICD-10-CM

## 2020-05-20 MED ORDER — PRAVASTATIN SODIUM 20 MG PO TABS: 20.0000 mg | ORAL_TABLET | Freq: Every day | ORAL | 1 refills | Status: DC

## 2020-05-20 NOTE — Telephone Encounter (Signed)
Pls contact pt regarding medicine [phone]  Need refill on pravastatin (PRAVACHOL) 20 MG tablet  [id number] - [place], [address] AT [address]

## 2020-05-21 ENCOUNTER — Other Ambulatory Visit: Payer: Self-pay

## 2020-05-21 DIAGNOSIS — Z8546 Personal history of malignant neoplasm of prostate: Secondary | ICD-10-CM

## 2020-05-21 MED ORDER — TAMSULOSIN HCL 0.4 MG PO CAPS: 0.8000 mg | ORAL_CAPSULE | Freq: Every day | ORAL | 1 refills | Status: DC

## 2020-05-21 NOTE — Telephone Encounter (Signed)
tamsulosin (FLOMAX) 0.4 MG CAPS capsule, refill request @  [facility], [place] AT [address] Phone:  [phone]  Fax:  [phone]

## 2020-06-10 ENCOUNTER — Other Ambulatory Visit: Payer: Self-pay

## 2020-06-10 ENCOUNTER — Ambulatory Visit: Payer: Medicare HMO | Admitting: Student

## 2020-06-19 ENCOUNTER — Encounter: Payer: Self-pay | Admitting: Student

## 2020-06-19 ENCOUNTER — Other Ambulatory Visit: Payer: Self-pay

## 2020-06-19 ENCOUNTER — Ambulatory Visit (INDEPENDENT_AMBULATORY_CARE_PROVIDER_SITE_OTHER): Payer: Medicare HMO | Admitting: Student

## 2020-06-19 DIAGNOSIS — Z0000 Encounter for general adult medical examination without abnormal findings: Secondary | ICD-10-CM | POA: Diagnosis not present

## 2020-06-19 NOTE — Patient Instructions (Addendum)
Things That May Be Affecting Your Health:  Alcohol X Hearing loss X Pain (Knee)   Depression  Home Safety  Sexual Health   Diabetes  Lack of physical activity  Stress   Difficulty with daily activities  Loneliness  Tiredness   Drug use  Medicines  Tobacco use   Falls  Motor Vehicle Safety  Weight   Food choices  Oral Health  Other    YOUR PERSONALIZED HEALTH PLAN : 1. Schedule your next subsequent Medicare Wellness visit in one year 2. Attend all of your regular appointments to address your medical issues 3. Complete the preventative screenings and services 4. Keep up the good work of walking 1-1.5 hours per day 4-5 days per week! 5. Keep appt with new PCP on 08/02/2020 at 2:45  Annual Wellness Visit                       Medicare Covered Preventative Screenings and [place] Men and Women Who How Often Need? Date of Last Service Action  Abdominal Aortic Aneurysm Adults with AAA risk factors Once     Alcohol Misuse and Counseling All Adults Screening once a year if no alcohol misuse. Counseling up to 4 face to face sessions.     Bone Density Measurement  Adults at risk for osteoporosis Once every 2 yrs     Lipid Panel Z13.6 All adults without CV disease Once every 5 yrs     Colorectal Cancer   Stool sample or  Colonoscopy All adults 64 and older   Once every year  Every 10 years     Depression All Adults Once a year  Today   Diabetes Screening Blood glucose, post glucose load, or GTT Z13.1  All adults at risk  Pre-diabetics  Once per year  Twice per year     Diabetes  Self-Management Training All adults Diabetics 10 hrs first year; 2 hours subsequent years. Requires Copay     Glaucoma  Diabetics  Family history of glaucoma  African Americans 71 yrs +  Hispanic Americans 22 yrs + Annually - requires coppay     Hepatitis C Z72.89 or F19.20  High Risk for HCV  Born between 1945 and 1965   Annually  Once     HIV Z11.4 All adults based on risk  Annually btw ages 20 & 55 regardless of risk  Annually > 65 yrs if at increased risk     Lung Cancer Screening Asymptomatic adults aged 59-77 with 30 pack yr history and current smoker OR quit within the last 15 yrs Annually Must have counseling and shared decision making documentation before first screen     Medical Nutrition Therapy Adults with   Diabetes  Renal disease  Kidney transplant within past 3 yrs 3 hours first year; 2 hours subsequent years     Obesity and Counseling All adults Screening once a year Counseling if BMI 30 or higher  Today   Tobacco Use Counseling Adults who use tobacco  Up to 8 visits in one year     Vaccines Z23  Hepatitis B  Influenza   Pneumonia  Adults   Once  Once every flu season  Two different vaccines separated by one year Y  Flu vaccine starting September 1  Thank you for getting your Covid Vaccines!  Next Annual Wellness Visit People with Medicare Every year Y [place] Women Who How Often Need  Date of Last Service Action  Mammogram  Z12.31 Women over 44 One baseline ages 34-39. Annually ager 40 yrs+     Pap tests All women Annually if high risk. Every 2 yrs for normal risk women     Screening for cervical cancer with   Pap (Z01.419 nl or Z01.411abnl) &  HPV Z11.51 Women aged 22 to 75 Once every 5 yrs     Screening pelvic and breast exams All women Annually if high risk. Every 2 yrs for normal risk women     Sexually Transmitted Diseases  Chlamydia  Gonorrhea  Syphilis All at risk adults Annually for non pregnant females at increased risk         [place] Men Who How Ofter Need  Date of Last Service Action  Prostate Cancer - DRE & PSA Men over 50 Annually.  DRE might require a copay.     Sexually Transmitted Diseases  Syphilis All at risk adults Annually for men at increased risk              Fall Prevention in the Home, Adult Falls can cause injuries. They can happen to people of all ages. There are many things you can do to make your home safe and to help prevent falls. Ask for help when making these changes, if needed. What actions can I take to prevent falls? General Instructions  Use good lighting in all rooms. Replace any light bulbs that burn out.  Turn on the lights when you go into a dark area. Use night-lights.  Keep items that you use often in easy-to-reach places. Lower the shelves around your home if necessary.  Set up your furniture so you have a clear path. Avoid moving your furniture around.  Do not have throw rugs and other things on the floor that can make you trip.  Avoid walking on wet floors.  If any of your floors are uneven, fix them.  Add color or contrast paint or tape to clearly mark and help you see: ? Any grab bars or handrails. ? First and last steps of stairways. ? Where the edge of each step is.  If you use a stepladder: ? Make sure that it is fully opened. Do not climb a closed stepladder. ? Make sure that both sides of the stepladder are locked into place. ? Ask someone to hold the stepladder for you while you use it.  If there are any pets around you, be aware of where they are. What can I do in the bathroom?      Keep the floor dry. Clean up any water that spills onto the floor as soon as it happens.  Remove soap buildup in the tub or shower regularly.  Use non-skid mats or decals on the floor of the tub or shower.  Attach bath mats securely with double-sided, non-slip rug tape.  If you need to sit down in the shower, use a plastic, non-slip stool.  Install grab bars by the toilet and in the tub and shower. Do not use towel bars as grab bars. What can I do in the bedroom?  Make sure that you have a light by your bed that is easy to reach.  Do not use any sheets or blankets that are too big for  your bed. They should not hang down onto the floor.  Have a firm chair that has side arms. You can use this for support while you get dressed. What can I  do in the kitchen?  Clean up any spills right away.  If you need to reach something above you, use a strong step stool that has a grab bar.  Keep electrical cords out of the way.  Do not use floor polish or wax that makes floors slippery. If you must use wax, use non-skid floor wax. What can I do with my stairs?  Do not leave any items on the stairs.  Make sure that you have a light switch at the top of the stairs and the bottom of the stairs. If you do not have them, ask someone to add them for you.  Make sure that there are handrails on both sides of the stairs, and use them. Fix handrails that are broken or loose. Make sure that handrails are as long as the stairways.  Install non-slip stair treads on all stairs in your home.  Avoid having throw rugs at the top or bottom of the stairs. If you do have throw rugs, attach them to the floor with carpet tape.  Choose a carpet that does not hide the edge of the steps on the stairway.  Check any carpeting to make sure that it is firmly attached to the stairs. Fix any carpet that is loose or worn. What can I do on the outside of my home?  Use bright outdoor lighting.  Regularly fix the edges of walkways and driveways and fix any cracks.  Remove anything that might make you trip as you walk through a door, such as a raised step or threshold.  Trim any bushes or trees on the path to your home.  Regularly check to see if handrails are loose or broken. Make sure that both sides of any steps have handrails.  Install guardrails along the edges of any raised decks and porches.  Clear walking paths of anything that might make someone trip, such as tools or rocks.  Have any leaves, snow, or ice cleared regularly.  Use sand or salt on walking paths during winter.  Clean up any spills  in your garage right away. This includes grease or oil spills. What other actions can I take?  Wear shoes that: ? Have a low heel. Do not wear high heels. ? Have rubber bottoms. ? Are comfortable and fit you well. ? Are closed at the toe. Do not wear open-toe sandals.  Use tools that help you move around (mobility aids) if they are needed. These include: ? Canes. ? Walkers. ? Scooters. ? Crutches.  Review your medicines with your doctor. Some medicines can make you feel dizzy. This can increase your chance of falling. Ask your doctor what other things you can do to help prevent falls. Where to find more information  Centers for Disease Control and Prevention, STEADI: https://garcia.biz/  [employer] on Aging: BrainJudge.co.uk Contact a doctor if:  You are afraid of falling at home.  You feel weak, drowsy, or dizzy at home.  You fall at home. Summary  There are many simple things that you can do to make your home safe and to help prevent falls.  Ways to make your home safe include removing tripping hazards and installing grab bars in the bathroom.  Ask for help when making these changes in your home. This information is not intended to replace advice given to you by your health care provider. Make sure you discuss any questions you have with your health care provider. Document Revised: 02/09/2019 Document Reviewed: 06/03/2017 Elsevier Patient Education  [place] Maintenance, Male Adopting a healthy lifestyle and getting preventive care are important in promoting health and wellness. Ask your health care provider about:  The right schedule for you to have regular tests and exams.  Things you can do on your own to prevent diseases and keep yourself healthy. What should I know about diet, weight, and exercise? Eat a healthy diet   Eat a diet that includes plenty of vegetables, fruits, low-fat dairy products, and lean protein.  Do not  eat a lot of foods that are high in solid fats, added sugars, or sodium. Maintain a healthy weight Body mass index (BMI) is a measurement that can be used to identify possible weight problems. It estimates body fat based on height and weight. Your health care provider can help determine your BMI and help you achieve or maintain a healthy weight. Get regular exercise Get regular exercise. This is one of the most important things you can do for your health. Most adults should:  Exercise for at least 150 minutes each week. The exercise should increase your heart rate and make you sweat (moderate-intensity exercise).  Do strengthening exercises at least twice a week. This is in addition to the moderate-intensity exercise.  Spend less time sitting. Even light physical activity can be beneficial. Watch cholesterol and blood lipids Have your blood tested for lipids and cholesterol at 83 years of age, then have this test every 5 years. You may need to have your cholesterol levels checked more often if:  Your lipid or cholesterol levels are high.  You are older than 83 years of age.  You are at high risk for heart disease. What should I know about cancer screening? Many types of cancers can be detected early and may often be prevented. Depending on your health history and family history, you may need to have cancer screening at various ages. This may include screening for:  Colorectal cancer.  Prostate cancer.  Skin cancer.  Lung cancer. What should I know about heart disease, diabetes, and high blood pressure? Blood pressure and heart disease  High blood pressure causes heart disease and increases the risk of stroke. This is more likely to develop in people who have high blood pressure readings, are of African descent, or are overweight.  Talk with your health care provider about your target blood pressure readings.  Have your blood pressure checked: ? Every 3-5 years if you are 40-66  years of age. ? Every year if you are 42 years old or older.  If you are between the ages of 37 and 25 and are a current or former smoker, ask your health care provider if you should have a one-time screening for abdominal aortic aneurysm (AAA). Diabetes Have regular diabetes screenings. This checks your fasting blood sugar level. Have the screening done:  Once every three years after age 81 if you are at a normal weight and have a low risk for diabetes.  More often and at a younger age if you are overweight or have a high risk for diabetes. What should I know about preventing infection? Hepatitis B If you have a higher risk for hepatitis B, you should be screened for this virus. Talk with your health care provider to find out if you are at risk for hepatitis B infection. Hepatitis C Blood testing is recommended for:  Everyone born from [birth year] through 1963.  Anyone with known risk factors for hepatitis C. Sexually transmitted infections (STIs)  You should be screened each year for STIs, including gonorrhea and chlamydia, if: ? You are sexually active and are younger than 83 years of age. ? You are older than 83 years of age and your health care provider tells you that you are at risk for this type of infection. ? Your sexual activity has changed since you were last screened, and you are at increased risk for chlamydia or gonorrhea. Ask your health care provider if you are at risk.  Ask your health care provider about whether you are at high risk for HIV. Your health care provider may recommend a prescription medicine to help prevent HIV infection. If you choose to take medicine to prevent HIV, you should first get tested for HIV. You should then be tested every 3 months for as long as you are taking the medicine. Follow these instructions at home: Lifestyle  Do not use any products that contain nicotine or tobacco, such as cigarettes, e-cigarettes, and chewing tobacco. If you need help  quitting, ask your health care provider.  Do not use street drugs.  Do not share needles.  Ask your health care provider for help if you need support or information about quitting drugs. Alcohol use  Do not drink alcohol if your health care provider tells you not to drink.  If you drink alcohol: ? Limit how much you have to 0-2 drinks a day. ? Be aware of how much alcohol is in your drink. In the [place] one drink equals one 12 oz bottle of beer (355 mL), one 5 oz glass of wine (148 mL), or one 1 oz glass of hard liquor (44 mL). General instructions  Schedule regular health, dental, and eye exams.  Stay current with your vaccines.  Tell your health care provider if: ? You often feel depressed. ? You have ever been abused or do not feel safe at home. Summary  Adopting a healthy lifestyle and getting preventive care are important in promoting health and wellness.  Follow your health care provider's instructions about healthy diet, exercising, and getting tested or screened for diseases.  Follow your health care provider's instructions on monitoring your cholesterol and blood pressure. This information is not intended to replace advice given to you by your health care provider. Make sure you discuss any questions you have with your health care provider. Document Revised: 10/12/2018 Document Reviewed: 10/12/2018 Elsevier Patient Education  2020 [employer].

## 2020-06-19 NOTE — Progress Notes (Signed)
This AWV is being conducted by [place] only. The patient was located at home with fiance and I was located in [facility]. The patient's identity was confirmed using their DOB and current address. The patient or his/her legal guardian has consented to being evaluated through a telephone encounter and understands the associated risks (an examination cannot be done and the patient may need to come in for an appointment) / benefits (allows the patient to remain at home, decreasing exposure to coronavirus). I personally spent 25 minutes conducting the AWV.  Subjective:   [name] is a 85 y.o. male who presents for a Medicare Annual Wellness Visit.  The following items have been reviewed and updated today in the appropriate area in the EMR.   Health Risk Assessment  Height, weight, BMI, and BP Visual acuity if needed Depression screen Fall risk / safety level Advance directive discussion Medical and family history were reviewed and updated Updating list of other providers & suppliers Medication reconciliation, including over the counter medicines Cognitive screen Written screening schedule Risk Factor list Personalized health advice, risky behaviors, and treatment advice  Social History   Social History Narrative   Retired   Single   Former Smoker   Alcohol use- no   Drug use- no      Current Social History 11/14/2018        Patient lives with Fiance in a home which is 1 story. There are 2 steps without handrails up to the entrance the patient uses. Patient states lack of handrails is not a problem for him.       Patient's method of transportation is personal car that fiance drives.      The highest level of education was 8th grade.      The patient currently retired from [employer].      Identified important Relationships are Fiance       Pets : None       Interests / Fun: watching sports on TV, walking       Current Stressors: "I'm not stressed about anything."        Religious / Personal Beliefs: Baptist       L. Alanya Vukelich, RN, BSN              Objective:    Vitals: There were no vitals taken for this visit. Vitals are unable to obtained due to [id number] public health emergency  Activities of Daily Living In your present state of health, do you have any difficulty performing the following activities: 06/19/2020 01/10/2020  Hearing? Y N  Comment Right ear -  Vision? N N  Difficulty concentrating or making decisions? N N  Walking or climbing stairs? N Y  Dressing or bathing? N N  Doing errands, shopping? N Y  Some recent data might be hidden    Goals Goals    . Continue walking 1-1.5 hours per day 4-5 days per week    . Prevent falls       Fall Risk Fall Risk  06/19/2020 01/10/2020 11/14/2018 08/22/2018 08/03/2018  Falls in the past year? 0 1 0 No No  Number falls in past yr: - 0 - - -  Injury with Fall? - 0 - - -  Risk for fall due to : Impaired mobility Impaired balance/gait - - -  Risk for fall due to: Comment Bilat knee pain - - - -  Follow up Education provided;Falls prevention discussed - - - -   CDC  Handout on Fall Prevention and Handout on Home Exercise Program, Access codes [email or web address] and [email or web address] mailed to patient with exercise band.   Depression Screen PHQ 2/9 Scores 06/19/2020 01/10/2020 11/14/2018 08/03/2018  PHQ - 2 Score 0 0 0 3  PHQ- 9 Score 0 0 1 4     Cognitive Testing Six-Item Cognitive Screener   "I would like to ask you some questions that ask you to use your memory. I am going to name three objects. Please wait until I say all three words, then repeat them. Remember what they are  because I am going to ask you to name them again in a few minutes. Please repeat these words for me: APPLE--TABLE--PENNY." (Interviewer may repeat names 3 times if necessary but repetition not scored.)  Did patient correctly repeat all three words? Yes - may proceed with screen  What year is this? Correct What month is this?  Correct What day of the week is this? Correct  What were the three objects I asked you to remember? . Apple Correct . Table Unable to state . Kieth Brightly Unable to state  Score one point for each incorrect answer.  A score of 2 or more points warrants additional investigation.  Patient's score 2   Patient is HOH. Fiance had to repeat words to him. This may have affected his ability to do this screen.    Assessment and Plan:    During the course of the visit the patient was educated and counseled about appropriate screening and preventive services as documented in the assessment and plan.  The printed AVS was given to the patient and included an updated screening schedule, a list of risk factors, and personalized health advice.        Velora Heckler, RN  06/19/2020

## 2020-06-19 NOTE — Progress Notes (Signed)
I discussed the AWV findings with the RN who conducted the visit. I was present in the office suite and immediately available to provide assistance and direction throughout the time the service was provided.   

## 2020-06-21 NOTE — Progress Notes (Signed)
Internal Medicine Clinic Attending  Case discussed with Dr. Krienke at the time of the visit.  We reviewed the AWV findings.  I agree with the assessment, diagnosis, and plan of care documented in the AWV note.       

## 2020-07-25 DIAGNOSIS — R9721 Rising PSA following treatment for malignant neoplasm of prostate: Secondary | ICD-10-CM | POA: Diagnosis not present

## 2020-07-30 ENCOUNTER — Ambulatory Visit (INDEPENDENT_AMBULATORY_CARE_PROVIDER_SITE_OTHER): Payer: Medicare HMO | Admitting: Podiatry

## 2020-07-30 ENCOUNTER — Encounter: Payer: Self-pay | Admitting: Podiatry

## 2020-07-30 ENCOUNTER — Other Ambulatory Visit: Payer: Self-pay

## 2020-07-30 DIAGNOSIS — L03031 Cellulitis of right toe: Secondary | ICD-10-CM

## 2020-07-30 DIAGNOSIS — B351 Tinea unguium: Secondary | ICD-10-CM | POA: Diagnosis not present

## 2020-07-30 NOTE — Progress Notes (Signed)
This patient presents  to the office for evaluation and treatment of long thick painful nails both feet. .  This patient is unable to trim his own nails since the patient cannot reach the feet.  Patient says the nails are painful walking and wearing his shoes. He says his big toenail right foot is broken down without any drainage or infection noted.Marland Kitchen   He presents  for preventive foot care services.  General Appearance  Alert, conversant and in no acute stress.  Vascular  Dorsalis pedis and posterior tibial  pulses are palpable right foot.  Dorsalis pedis and posterior tibial pulses are absent left foot.  .  Capillary return is within normal limits  bilaterally. Temperature is within normal limits  bilaterally.  Neurologic  Senn-Weinstein monofilament wire test diminished   bilaterally. Muscle power within normal limits bilaterally.  Nails Thick disfigured discolored nails with subungual debris  from hallux to fifth toes bilaterally. No evidence of bacterial infection or drainage bilaterally. Fragmented disfigured right hallux.    Orthopedic  No limitations of motion  feet .  No crepitus or effusions noted.  No bony pathology or digital deformities noted.  HAV  B/L.    Skin  normotropic skin with no porokeratosis noted bilaterally.  No signs of infections or ulcers noted.     Onychomycosis  Pain in toes right foot  Pain in toes left foot  Paronychia right hallux  Debridement  of nails  1-5  B/L with a nail nipper.  Nails were then filed using a dremel tool with no incidents.   Upon debriding the right hallux nail,  The nail fragments were debrided exposing the nail bed.   The nail was removed from right hallux exposing nail bed.  This site was then bandaged with neosporin/DSD.  Home instructions given to patient.    Prior to debriding the skin, this nail gave the appearance of verrucous tissue.    Call the office if this nail worsens.   Gardiner Barefoot DPM

## 2020-08-01 DIAGNOSIS — N3941 Urge incontinence: Secondary | ICD-10-CM | POA: Diagnosis not present

## 2020-08-01 DIAGNOSIS — R3912 Poor urinary stream: Secondary | ICD-10-CM | POA: Diagnosis not present

## 2020-08-01 DIAGNOSIS — C61 Malignant neoplasm of prostate: Secondary | ICD-10-CM | POA: Diagnosis not present

## 2020-08-02 ENCOUNTER — Encounter: Payer: Medicare HMO | Admitting: Student

## 2020-08-05 ENCOUNTER — Ambulatory Visit (INDEPENDENT_AMBULATORY_CARE_PROVIDER_SITE_OTHER): Payer: Medicare HMO | Admitting: Student

## 2020-08-05 ENCOUNTER — Other Ambulatory Visit: Payer: Self-pay

## 2020-08-05 ENCOUNTER — Encounter: Payer: Self-pay | Admitting: Student

## 2020-08-05 ENCOUNTER — Other Ambulatory Visit: Payer: Self-pay | Admitting: Student

## 2020-08-05 VITALS — BP 162/80 | HR 61 | Temp 98.1°F | Ht 67.0 in | Wt 162.4 lb

## 2020-08-05 DIAGNOSIS — E7800 Pure hypercholesterolemia, unspecified: Secondary | ICD-10-CM | POA: Diagnosis not present

## 2020-08-05 DIAGNOSIS — M1712 Unilateral primary osteoarthritis, left knee: Secondary | ICD-10-CM | POA: Diagnosis not present

## 2020-08-05 DIAGNOSIS — Z299 Encounter for prophylactic measures, unspecified: Secondary | ICD-10-CM

## 2020-08-05 DIAGNOSIS — Z8546 Personal history of malignant neoplasm of prostate: Secondary | ICD-10-CM | POA: Diagnosis not present

## 2020-08-05 DIAGNOSIS — I10 Essential (primary) hypertension: Secondary | ICD-10-CM | POA: Diagnosis not present

## 2020-08-05 DIAGNOSIS — B351 Tinea unguium: Secondary | ICD-10-CM

## 2020-08-05 DIAGNOSIS — R7989 Other specified abnormal findings of blood chemistry: Secondary | ICD-10-CM | POA: Diagnosis not present

## 2020-08-05 DIAGNOSIS — E785 Hyperlipidemia, unspecified: Secondary | ICD-10-CM | POA: Diagnosis not present

## 2020-08-05 LAB — POCT GLYCOSYLATED HEMOGLOBIN (HGB A1C): Hemoglobin A1C: 5.2 % (ref 4.0–5.6)

## 2020-08-05 LAB — GLUCOSE, CAPILLARY: Glucose-Capillary: 90 mg/dL (ref 70–99)

## 2020-08-05 MED ORDER — LOSARTAN POTASSIUM-HCTZ 50-12.5 MG PO TABS: 1.0000 | ORAL_TABLET | Freq: Every day | ORAL | 0 refills | Status: DC

## 2020-08-05 NOTE — Assessment & Plan Note (Signed)
Patient denies any urinary symptoms at the moment.  According to significant other, patient's PSA was significantly elevated and he is scheduled to follow-up with his urologist in December.

## 2020-08-05 NOTE — Assessment & Plan Note (Signed)
Significant other states patient has been eating a lot of [employer] fish sandwich.  Otherwise, he has been eating relatively healthy foods.  Patient is currently on pravastatin 40 mg daily.  Plan: --Continue pravastatin 40 mg daily --F/u lipid panel --Advised to cut down on fried foods

## 2020-08-05 NOTE — Assessment & Plan Note (Signed)
Patient presented for refill on his BP medications.  He was found to have a BP of 186/81 on arrival. Repeat BP showed 162/80. Patient states he was told to stop taking his lisinopril and amlodipine and has not been on any hypertensive for the last few months. He denies any headaches, dizziness, blurry vision or chest pain. Creatinine of 1.27 six months ago from 1.76 nin months ago.  We will start on a new antihypertensive and monitor kidney function.  Plan: --Start Hyzaar 50-12.5 mg daily --Repeat BMP --BP check at next follow-up visit

## 2020-08-05 NOTE — Assessment & Plan Note (Signed)
Patient states he saw the podiatrist last week and they debrided his right big toe.  Patient denies any pain in her toes.
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
## 2020-08-05 NOTE — Patient Instructions (Signed)
Thank you, [name] for allowing [place] to provide your care today. Today we discussed your high blood pressure. We started you on a new blood pressure medication and ordered some labs to look at your kidney function. We want you to start using the Voltaren gel on your knee.     I have ordered the following labs for you:   Lab Orders     BMP8+Anion Gap     Lipid Profile     CBC with Diff     POC Hbg A1C   I will call if any are abnormal. All of your labs can be accessed through "My Chart".   I have ordered the following medication/changed the following medications:  1. Hyzaar 50-12.5 mg, take 1 tablet daily  Please follow-up in 4 weeks  Should you have any questions or concerns please call the internal medicine clinic at [phone].    Linwood Dibbles, MD, MPH [facility]   My Chart Access: [email or web address]   If you have not already done so, please get your COVID 19 vaccine  To schedule an appointment for a COVID vaccine choice any of the following: Go to [email or web address]   Go to [email or web address]                  Call [phone]                                     Call [phone] and select Option 2

## 2020-08-05 NOTE — Assessment & Plan Note (Signed)
Patient reports ongoing left knee pain with associated knee swelling. Patient states this pain is worse when he walks and he feels that his knee is giving out.  He denies any trauma to the knee but states he has worked in [employer] for long time and continues to stay active. No concerning findings on exam to indicate urgent evaluation at this time. Likely osteoarthritis from years of working in [employer].  Patient was prescribed Voltaren gel but states he has not taken it. He was advised to start applying the gel on his left knee and follow-up as needed.  Plan: --Voltaren 1% 2 g gel apply 4 times as needed for pain --Continue knee brace as needed for support --Consider imaging if no improvement at follow-up visit.

## 2020-08-06 LAB — LIPID PANEL
Chol/HDL Ratio: 2.4 ratio (ref 0.0–5.0)
Cholesterol, Total: 196 mg/dL (ref 100–199)
HDL: 81 mg/dL (ref >39)
LDL Chol Calc (NIH): 99 mg/dL (ref 0–99)
Triglycerides: 88 mg/dL (ref 0–149)
VLDL Cholesterol Cal: 16 mg/dL (ref 5–40)

## 2020-08-06 LAB — BMP8+ANION GAP
Anion Gap: 13 mmol/L (ref 10.0–18.0)
BUN/Creatinine Ratio: 17 (ref 10–24)
BUN: 20 mg/dL (ref 8–27)
CO2: 24 mmol/L (ref 20–29)
Calcium: 9.3 mg/dL (ref 8.6–10.2)
Chloride: 107 mmol/L — ABNORMAL HIGH (ref 96–106)
Creatinine, Ser: 1.17 mg/dL (ref 0.76–1.27)
GFR calc Af Amer: 66 mL/min/{1.73_m2} (ref >59)
GFR calc non Af Amer: 57 mL/min/{1.73_m2} — ABNORMAL LOW (ref >59)
Glucose: 87 mg/dL (ref 65–99)
Potassium: 4.6 mmol/L (ref 3.5–5.2)
Sodium: 144 mmol/L (ref 134–144)

## 2020-08-06 LAB — CBC WITH DIFFERENTIAL/PLATELET
Basophils Absolute: 0 10*3/uL (ref 0.0–0.2)
Basos: 1 %
EOS (ABSOLUTE): 0.1 10*3/uL (ref 0.0–0.4)
Eos: 2 %
Hematocrit: 36.1 % — ABNORMAL LOW (ref 37.5–51.0)
Hemoglobin: 11.9 g/dL — ABNORMAL LOW (ref 13.0–17.7)
Immature Grans (Abs): 0 10*3/uL (ref 0.0–0.1)
Immature Granulocytes: 0 %
Lymphocytes Absolute: 1.7 10*3/uL (ref 0.7–3.1)
Lymphs: 36 %
MCH: 31.5 pg (ref 26.6–33.0)
MCHC: 33 g/dL (ref 31.5–35.7)
MCV: 96 fL (ref 79–97)
Monocytes Absolute: 0.6 10*3/uL (ref 0.1–0.9)
Monocytes: 12 %
Neutrophils Absolute: 2.2 10*3/uL (ref 1.4–7.0)
Neutrophils: 49 %
Platelets: 201 10*3/uL (ref 150–450)
RBC: 3.78 x10E6/uL — ABNORMAL LOW (ref 4.14–5.80)
RDW: 11.9 % (ref 11.6–15.4)
WBC: 4.6 10*3/uL (ref 3.4–10.8)

## 2020-08-08 NOTE — Progress Notes (Signed)
Internal Medicine Clinic Attending  I saw and evaluated the patient.  I personally confirmed the key portions of the history and exam documented by Dr. Amponsah and I reviewed pertinent patient test results.  The assessment, diagnosis, and plan were formulated together and I agree with the documentation in the resident's note.  

## 2020-08-26 ENCOUNTER — Other Ambulatory Visit: Payer: Self-pay | Admitting: Internal Medicine

## 2020-08-26 DIAGNOSIS — Z8546 Personal history of malignant neoplasm of prostate: Secondary | ICD-10-CM

## 2020-09-02 ENCOUNTER — Encounter: Payer: Self-pay | Admitting: Internal Medicine

## 2020-09-02 ENCOUNTER — Ambulatory Visit (INDEPENDENT_AMBULATORY_CARE_PROVIDER_SITE_OTHER): Payer: Medicare HMO | Admitting: Internal Medicine

## 2020-09-02 ENCOUNTER — Other Ambulatory Visit: Payer: Self-pay

## 2020-09-02 VITALS — BP 192/80 | HR 52 | Temp 97.9°F | Ht 67.0 in | Wt 162.7 lb

## 2020-09-02 DIAGNOSIS — M1712 Unilateral primary osteoarthritis, left knee: Secondary | ICD-10-CM

## 2020-09-02 DIAGNOSIS — I10 Essential (primary) hypertension: Secondary | ICD-10-CM | POA: Diagnosis not present

## 2020-09-02 MED ORDER — AMLODIPINE BESYLATE 10 MG PO TABS: 10.0000 mg | ORAL_TABLET | Freq: Every day | ORAL | 2 refills | Status: DC
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
## 2020-09-02 NOTE — Assessment & Plan Note (Signed)
[name] is a 85 y.o. with a history of prostate cancer, hypertension, hyperlipidemia, and left knee osteoarthritis presenting for follow-up of his hypertension.  He denies any symptoms including lightheadedness, dizziness, chest pain, shortness of breath, fevers, chills, nausea, vomiting, or other symptoms.  He does report a mild headache.  He was restarted on his Hyzaar 50-12.5 mg daily his last visit,he  previously had his medication stopped.  His BMP at that time showed a creatinine of 1.17, actually improved from 1.27 previously.  He states that he does eat out at fast food restaurants quite a lot, eats a lot of fried fish sandwiches.  His fiance was with him and states that they do have a blood pressure cuff at home.  Blood pressure today was elevated at 179/78, repeat actually increased to 192/80.  We discussed making dietary adjustments and resuming his amlodipine that he had been on previously.  They are in agreement with this.  Provided patient with ambulatory blood pressure favored to monitor his blood pressures and provided information on DASH diet.  -Continue Hyzaar 50-12.5 mg daily -Add amlodipine 10 mg daily -Provided information on DASH diet -RTC in 1 month

## 2020-09-02 NOTE — Patient Instructions (Addendum)
[name],  It was a pleasure to see you today. Thank you for coming in.   Today we discussed your blood pressure. Your blood pressure was still a little high today. Please start taking the amlodipine 10 mg daily in addition to the Hyzaar. Please stop eating out at fast food restaurants, try to decrease your fried fish intake.    We also discussed your knee pain. Please continue taking the voltaren gel for the knee pain.   Please return to clinic in 1 month or sooner if needed.   Thank you again for coming in.   [name] M.D.  [place] stands for "Dietary Approaches to Stop Hypertension." The DASH eating plan is a healthy eating plan that has been shown to reduce high blood pressure (hypertension). It may also reduce your risk for type 2 diabetes, heart disease, and stroke. The DASH eating plan may also help with weight loss. What are tips for following this plan?  General guidelines  Avoid eating more than 2,300 mg (milligrams) of salt (sodium) a day. If you have hypertension, you may need to reduce your sodium intake to 1,500 mg a day.  Limit alcohol intake to no more than 1 drink a day for nonpregnant women and 2 drinks a day for men. One drink equals 12 oz of beer, 5 oz of wine, or 1 oz of hard liquor.  Work with your health care provider to maintain a healthy body weight or to lose weight. Ask what an ideal weight is for you.  Get at least 30 minutes of exercise that causes your heart to beat faster (aerobic exercise) most days of the week. Activities may include walking, swimming, or biking.  Work with your health care provider or diet and nutrition specialist (dietitian) to adjust your eating plan to your individual calorie needs. Reading food labels   Check food labels for the amount of sodium per serving. Choose foods with less than 5 percent of the Daily Value of sodium. Generally, foods with less than 300 mg of sodium per serving fit into this  eating plan.  To find whole grains, look for the word "whole" as the first word in the ingredient list. Shopping  Buy products labeled as "low-sodium" or "no salt added."  Buy fresh foods. Avoid canned foods and premade or frozen meals. Cooking  Avoid adding salt when cooking. Use salt-free seasonings or herbs instead of table salt or sea salt. Check with your health care provider or pharmacist before using salt substitutes.  Do not fry foods. Cook foods using healthy methods such as baking, boiling, grilling, and broiling instead.  Cook with heart-healthy oils, such as olive, canola, soybean, or sunflower oil. Meal planning  Eat a balanced diet that includes: ? 5 or more servings of fruits and vegetables each day. At each meal, try to fill half of your plate with fruits and vegetables. ? Up to 6-8 servings of whole grains each day. ? Less than 6 oz of lean meat, poultry, or fish each day. A 3-oz serving of meat is about the same size as a deck of cards. One egg equals 1 oz. ? 2 servings of low-fat dairy each day. ? A serving of nuts, seeds, or beans 5 times each week. ? Heart-healthy fats. Healthy fats called Omega-3 fatty acids are found in foods such as flaxseeds and coldwater fish, like sardines, salmon, and mackerel.  Limit how much you eat of the following: ? Canned or prepackaged  foods. ? Food that is high in trans fat, such as fried foods. ? Food that is high in saturated fat, such as fatty meat. ? Sweets, desserts, sugary drinks, and other foods with added sugar. ? Full-fat dairy products.  Do not salt foods before eating.  Try to eat at least 2 vegetarian meals each week.  Eat more home-cooked food and less restaurant, buffet, and fast food.  When eating at a restaurant, ask that your food be prepared with less salt or no salt, if possible. What foods are recommended? The items listed may not be a complete list. Talk with your dietitian about what dietary choices are  best for you. Grains Whole-grain or whole-wheat bread. Whole-grain or whole-wheat pasta. Court rice. [place]. Bulgur. Whole-grain and low-sodium cereals. Pita bread. Low-fat, low-sodium crackers. Whole-wheat flour tortillas. Vegetables Fresh or frozen vegetables (raw, steamed, roasted, or grilled). Low-sodium or reduced-sodium tomato and vegetable juice. Low-sodium or reduced-sodium tomato sauce and tomato paste. Low-sodium or reduced-sodium canned vegetables. Fruits All fresh, dried, or frozen fruit. Canned fruit in natural juice (without added sugar). Meat and other protein foods Skinless chicken or [place]. Ground chicken or [place]. Pork with fat trimmed off. Fish and seafood. Egg whites. Dried beans, peas, or lentils. Unsalted nuts, nut butters, and seeds. Unsalted canned beans. Lean cuts of beef with fat trimmed off. Low-sodium, lean deli meat. Dairy Low-fat (1%) or fat-free (skim) milk. Fat-free, low-fat, or reduced-fat cheeses. Nonfat, low-sodium ricotta or cottage cheese. Low-fat or nonfat yogurt. Low-fat, low-sodium cheese. Fats and oils Soft margarine without trans fats. Vegetable oil. Low-fat, reduced-fat, or light mayonnaise and salad dressings (reduced-sodium). Canola, safflower, olive, soybean, and sunflower oils. Avocado. Seasoning and other foods Herbs. Spices. Seasoning mixes without salt. Unsalted popcorn and pretzels. Fat-free sweets. What foods are not recommended? The items listed may not be a complete list. Talk with your dietitian about what dietary choices are best for you. Grains Baked goods made with fat, such as croissants, muffins, or some breads. Dry pasta or rice meal packs. Vegetables Creamed or fried vegetables. Vegetables in a cheese sauce. Regular canned vegetables (not low-sodium or reduced-sodium). Regular canned tomato sauce and paste (not low-sodium or reduced-sodium). Regular tomato and vegetable juice (not low-sodium or reduced-sodium). [name].  Olives. Fruits Canned fruit in a light or heavy syrup. Fried fruit. Fruit in cream or butter sauce. Meat and other protein foods Fatty cuts of meat. Ribs. Fried meat. [name]. Sausage. Bologna and other processed lunch meats. Salami. Fatback. Hotdogs. Bratwurst. Salted nuts and seeds. Canned beans with added salt. Canned or smoked fish. Whole eggs or egg yolks. Chicken or [place] with skin. Dairy Whole or 2% milk, cream, and half-and-half. Whole or full-fat cream cheese. Whole-fat or sweetened yogurt. Full-fat cheese. Nondairy creamers. Whipped toppings. Processed cheese and cheese spreads. Fats and oils Butter. Stick margarine. Lard. Shortening. Ghee. Bacon fat. Tropical oils, such as coconut, palm kernel, or palm oil. Seasoning and other foods Salted popcorn and pretzels. Onion salt, garlic salt, seasoned salt, table salt, and sea salt. Worcestershire sauce. Tartar sauce. Barbecue sauce. Teriyaki sauce. Soy sauce, including reduced-sodium. Steak sauce. Canned and packaged gravies. Fish sauce. Oyster sauce. Cocktail sauce. Horseradish that you find on the shelf. Ketchup. Mustard. Meat flavorings and tenderizers. Bouillon cubes. Hot sauce and Tabasco sauce. Premade or packaged marinades. Premade or packaged taco seasonings. Relishes. Regular salad dressings. Where to find more information:  National Heart, Lung, and [place]: https://wilson-eaton.com/  American Heart Association: www.heart.org Summary  The DASH eating  plan is a healthy eating plan that has been shown to reduce high blood pressure (hypertension). It may also reduce your risk for type 2 diabetes, heart disease, and stroke.  With the DASH eating plan, you should limit salt (sodium) intake to 2,300 mg a day. If you have hypertension, you may need to reduce your sodium intake to 1,500 mg a day.  When on the DASH eating plan, aim to eat more fresh fruits and vegetables, whole grains, lean proteins, low-fat dairy, and heart-healthy  fats.  Work with your health care provider or diet and nutrition specialist (dietitian) to adjust your eating plan to your individual calorie needs. This information is not intended to replace advice given to you by your health care provider. Make sure you discuss any questions you have with your health care provider. Document Revised: 10/01/2017 Document Reviewed: 10/12/2016 Elsevier Patient Education  2020 [employer].

## 2020-09-02 NOTE — Assessment & Plan Note (Signed)
Patient continues to endorse some mild left knee pain, feels that the swelling has mildly improved.  He has been taking the Voltaren gel and states that it is helped significantly, is currently using a knee brace.  Does seem to be consistent with osteoarthritis.  Discussed that if symptoms worsen or do not improve then can consider imaging versus steroid injection in the future.

## 2020-09-06 ENCOUNTER — Other Ambulatory Visit: Payer: Self-pay | Admitting: Student

## 2020-09-06 NOTE — Progress Notes (Signed)
Internal Medicine Clinic Attending ° °Case discussed with Dr. Krienke  At the time of the visit.  We reviewed the resident’s history and exam and pertinent patient test results.  I agree with the assessment, diagnosis, and plan of care documented in the resident’s note.  °

## 2020-10-02 ENCOUNTER — Ambulatory Visit (INDEPENDENT_AMBULATORY_CARE_PROVIDER_SITE_OTHER): Payer: Medicare HMO | Admitting: Student

## 2020-10-02 ENCOUNTER — Encounter: Payer: Self-pay | Admitting: Student

## 2020-10-02 ENCOUNTER — Other Ambulatory Visit: Payer: Self-pay

## 2020-10-02 VITALS — BP 119/63 | HR 68 | Temp 97.4°F | Ht 70.0 in | Wt 164.9 lb

## 2020-10-02 DIAGNOSIS — H9113 Presbycusis, bilateral: Secondary | ICD-10-CM

## 2020-10-02 DIAGNOSIS — Z23 Encounter for immunization: Secondary | ICD-10-CM | POA: Diagnosis not present

## 2020-10-02 DIAGNOSIS — I10 Essential (primary) hypertension: Secondary | ICD-10-CM | POA: Diagnosis not present
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
## 2020-10-02 NOTE — Patient Instructions (Signed)
[name],  It was a pleasure meeting you today.  For your blood pressure: Keep taking the amlodipine and Hyzaar every day. Your blood pressure is very well controlled in clinic. You can also continue checking your blood pressure at home. Our goal for you is a blood pressure of less than 130.  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 I have placed a referral to an audiologist who can perform a hearing examination and get you hearing aids if needed.  For your COVID vaccination status: We strongly encourage you to get a booster because of your age and other medical conditions that put you at a high risk for serious infection.  Sincerely, Dr. Paulla Dolly, MD

## 2020-10-02 NOTE — Assessment & Plan Note (Signed)
Patient's fiance mentions that patient's hearing loss has been progressively worsening, particularly over the last six months. He is interested in having a formal evaluation and consideration for hearing aids. On examination, he appears to have bilateral sensorineural hearing loss. Examination of ear is unremarkable. -Refer to audiology
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
## 2020-10-02 NOTE — Assessment & Plan Note (Signed)
Patient's blood pressure very well-controlled on current regimen (amlodipine 10mg  daily and Hyzaar 50-12.5mg  daily). Patient's fiance checks his blood pressure at home and denies any episodes of hypotension. -Check BMP today -Continue amlodipine and Hyzaar

## 2020-10-02 NOTE — Assessment & Plan Note (Signed)
Discussed with patient and patient's fiance the importance of getting the COVID-19 booster given his age and comorbidities. He reports that he is not interested at this time.

## 2020-10-03 LAB — BMP8+ANION GAP
Anion Gap: 12 mmol/L (ref 10.0–18.0)
BUN/Creatinine Ratio: 18 (ref 10–24)
BUN: 24 mg/dL (ref 8–27)
CO2: 24 mmol/L (ref 20–29)
Calcium: 8.8 mg/dL (ref 8.6–10.2)
Chloride: 106 mmol/L (ref 96–106)
Creatinine, Ser: 1.33 mg/dL — ABNORMAL HIGH (ref 0.76–1.27)
GFR calc Af Amer: 57 mL/min/{1.73_m2} — ABNORMAL LOW (ref >59)
GFR calc non Af Amer: 49 mL/min/{1.73_m2} — ABNORMAL LOW (ref >59)
Glucose: 91 mg/dL (ref 65–99)
Potassium: 4.5 mmol/L (ref 3.5–5.2)
Sodium: 142 mmol/L (ref 134–144)

## 2020-10-04 NOTE — Progress Notes (Signed)
Internal Medicine Clinic Attending  I saw and evaluated the patient.  I personally confirmed the key portions of the history and exam documented by Dr. Johnson and I reviewed pertinent patient test results.  The assessment, diagnosis, and plan were formulated together and I agree with the documentation in the resident's note.  

## 2020-10-24 ENCOUNTER — Other Ambulatory Visit: Payer: Self-pay | Admitting: Internal Medicine

## 2020-10-24 DIAGNOSIS — Z8546 Personal history of malignant neoplasm of prostate: Secondary | ICD-10-CM

## 2020-10-24 DIAGNOSIS — R9721 Rising PSA following treatment for malignant neoplasm of prostate: Secondary | ICD-10-CM | POA: Diagnosis not present

## 2020-10-30 ENCOUNTER — Encounter: Payer: Self-pay | Admitting: Podiatry

## 2020-10-30 ENCOUNTER — Ambulatory Visit (INDEPENDENT_AMBULATORY_CARE_PROVIDER_SITE_OTHER): Payer: Medicare HMO | Admitting: Podiatry

## 2020-10-30 ENCOUNTER — Other Ambulatory Visit: Payer: Self-pay

## 2020-10-30 DIAGNOSIS — B351 Tinea unguium: Secondary | ICD-10-CM | POA: Diagnosis not present

## 2020-10-30 NOTE — Progress Notes (Signed)
This patient presents  to the office for evaluation and treatment of long thick painful nails both feet. .  This patient is unable to trim his own nails since the patient cannot reach the feet.  Patient says the nails are painful walking and wearing his shoes. He says his big toenail right foot is broken down without any drainage or infection noted.Marland Kitchen   He presents  for preventive foot care services.  General Appearance  Alert, conversant and in no acute stress.  Vascular  Dorsalis pedis and posterior tibial  pulses are palpable right foot.  Dorsalis pedis and posterior tibial pulses are absent left foot.  .  Capillary return is within normal limits  bilaterally. Temperature is within normal limits  bilaterally.  Neurologic  Senn-Weinstein monofilament wire test diminished   bilaterally. Muscle power within normal limits bilaterally.  Nails Thick disfigured discolored nails with subungual debris  from hallux to fifth toes bilaterally. No evidence of bacterial infection or drainage bilaterally. Fragmented disfigured right hallux.    Orthopedic  No limitations of motion  feet .  No crepitus or effusions noted.  No bony pathology or digital deformities noted.  HAV  B/L.    Skin  normotropic skin with no porokeratosis noted bilaterally.  No signs of infections or ulcers noted.     Onychomycosis  Pain in toes right foot  Pain in toes left foot  Paronychia right hallux  Debridement  of nails  1-5  B/L with a nail nipper.  Nails were then filed using a dremel tool with no incidents.   Upon debriding the right hallux nail,  The nail fragments were debrided exposing the nail bed.   This site was then bandaged with neosporin/DSD.   Call the office if this nail worsens.   Gardiner Barefoot DPM

## 2020-10-31 DIAGNOSIS — R3915 Urgency of urination: Secondary | ICD-10-CM | POA: Diagnosis not present

## 2020-10-31 DIAGNOSIS — R3912 Poor urinary stream: Secondary | ICD-10-CM | POA: Diagnosis not present

## 2020-10-31 DIAGNOSIS — C61 Malignant neoplasm of prostate: Secondary | ICD-10-CM | POA: Diagnosis not present

## 2020-11-08 ENCOUNTER — Other Ambulatory Visit (HOSPITAL_COMMUNITY): Payer: Self-pay | Admitting: Urology

## 2020-11-08 DIAGNOSIS — R9721 Rising PSA following treatment for malignant neoplasm of prostate: Secondary | ICD-10-CM

## 2020-11-20 ENCOUNTER — Encounter: Payer: Medicare HMO | Admitting: Student

## 2020-11-20 ENCOUNTER — Ambulatory Visit (HOSPITAL_COMMUNITY): Payer: Medicare HMO

## 2020-11-22 ENCOUNTER — Ambulatory Visit (HOSPITAL_COMMUNITY)
Admission: RE | Admit: 2020-11-22 | Discharge: 2020-11-22 | Disposition: A | Discharge status: Home / Self Care | Payer: Medicare Other | Source: Ambulatory Visit | Attending: Urology | Admitting: Urology

## 2020-11-22 ENCOUNTER — Other Ambulatory Visit: Payer: Self-pay

## 2020-11-22 DIAGNOSIS — R9721 Rising PSA following treatment for malignant neoplasm of prostate: Secondary | ICD-10-CM | POA: Insufficient documentation

## 2020-11-22 MED ORDER — PIFLIFOLASTAT F 18 (PYLARIFY) INJECTION
9.0000 | Freq: Once | INTRAVENOUS | Status: AC
  Administered 2020-11-22: 8 via INTRAVENOUS

## 2020-12-02 ENCOUNTER — Other Ambulatory Visit: Payer: Self-pay | Admitting: Internal Medicine

## 2020-12-02 DIAGNOSIS — E785 Hyperlipidemia, unspecified: Secondary | ICD-10-CM

## 2020-12-09 ENCOUNTER — Other Ambulatory Visit: Payer: Self-pay | Admitting: Student

## 2020-12-10 ENCOUNTER — Encounter: Payer: Self-pay | Admitting: Radiation Oncology

## 2020-12-10 NOTE — Progress Notes (Signed)
  Radiation Oncology         [phone]) [phone] ________________________________  Name: [name] MRN: [id number]  Date: 12/10/2020  DOB: January 16, 1935  Chart Note:  In anticipation of discussing this case in prostate tumor board on 12/13/20, I pulled his radiation treatment summary from our [place] [facility] EMR dated 04/07/2010   I also screen captured his isodose plan showing the radiation distribution in three planes.      ________________________________  Sheral Apley Tammi Klippel, M.D.

## 2020-12-12 ENCOUNTER — Ambulatory Visit (INDEPENDENT_AMBULATORY_CARE_PROVIDER_SITE_OTHER): Payer: Medicare Other | Admitting: Student

## 2020-12-12 ENCOUNTER — Encounter: Payer: Self-pay | Admitting: Student

## 2020-12-12 ENCOUNTER — Other Ambulatory Visit: Payer: Self-pay | Admitting: *Deleted

## 2020-12-12 DIAGNOSIS — C61 Malignant neoplasm of prostate: Secondary | ICD-10-CM

## 2020-12-12 DIAGNOSIS — I10 Essential (primary) hypertension: Secondary | ICD-10-CM

## 2020-12-12 NOTE — Assessment & Plan Note (Signed)
Patient with history of prostate cancer s/p XRT 2011. However, he does have recurrence of prostate cancer with osseous metastases in T9 and L5 along with a new right lower lobe pulmonary nodule (concerning for pulmonary mets). He follows Dr. Louis Meckel, [facility], for this and is scheduled to see him in March 2022. I contacted [facility] and they are aware of the new imaging results, but they do not have any earlier dates to see patient.   Plan: -see Dr. Louis Meckel to devise subsequent plan moving forward -f/u in 3 months to make sure steps are taken to address this issue

## 2020-12-12 NOTE — Patient Instructions (Signed)
[name],  It was a pleasure seeing you in the clinic today.   We discussed the following today:  1. Your blood pressure looks good today. Please take home blood pressure readings to make sure your blood pressure is not too low.  2. I will be following up with Dr. Louis Meckel to figure out the next steps about your prostate cancer.  Please call our clinic at [phone] if you have any questions or concerns. The best time to call is Monday-Friday from 9am-4pm, but there is someone available 24/7 at the same number. If you need medication refills, please notify your pharmacy one week in advance and they will send [place] a request.   Thank you for letting us take part in your care. We look forward to seeing you next time!

## 2020-12-12 NOTE — Assessment & Plan Note (Signed)
Patient's BP well controlled on hyzaar and norvasc. Discussed taking home BP readings and notifying me if his BP readings are <110/60. May need to consider decreasing norvasc dose if this occurs.
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
## 2020-12-15 NOTE — Progress Notes (Signed)
Internal Medicine Clinic Attending  Case discussed with Dr. Jinwala  At the time of the visit.  We reviewed the resident's history and exam and pertinent patient test results.  I agree with the assessment, diagnosis, and plan of care documented in the resident's note.  

## 2021-01-29 ENCOUNTER — Ambulatory Visit (INDEPENDENT_AMBULATORY_CARE_PROVIDER_SITE_OTHER): Payer: Medicare Other | Admitting: Podiatry

## 2021-01-29 ENCOUNTER — Encounter: Payer: Self-pay | Admitting: Podiatry

## 2021-01-29 ENCOUNTER — Other Ambulatory Visit: Payer: Self-pay

## 2021-01-29 DIAGNOSIS — B351 Tinea unguium: Secondary | ICD-10-CM

## 2021-01-29 DIAGNOSIS — L03031 Cellulitis of right toe: Secondary | ICD-10-CM

## 2021-01-29 NOTE — Progress Notes (Signed)
This patient presents  to the office for evaluation and treatment of long thick painful nails both feet. .  This patient is unable to trim his own nails since the patient cannot reach the feet.  Patient says the nails are painful walking and wearing his shoes. He says his big toenail right foot is broken down without any drainage or infection noted.[name]   He presents  for preventive foot care services.  General Appearance  Alert, conversant and in no acute stress.  Vascular  Dorsalis pedis and posterior tibial  pulses are palpable right foot.  Dorsalis pedis and posterior tibial pulses are absent left foot.  .  Capillary return is within normal limits  bilaterally. Temperature is within normal limits  bilaterally.  Neurologic  Senn-Weinstein monofilament wire test diminished   bilaterally. Muscle power within normal limits bilaterally.  Nails Thick disfigured discolored nails with subungual debris  from hallux to fifth toes bilaterally. No evidence of bacterial infection or drainage bilaterally. Fragmented disfigured right hallux.    Orthopedic  No limitations of motion  feet .  No crepitus or effusions noted.  No bony pathology or digital deformities noted.  HAV  B/L.    Skin  normotropic skin with no porokeratosis noted bilaterally.  No signs of infections or ulcers noted.     Onychomycosis  Pain in toes right foot  Pain in toes left foot  Paronychia right hallux  Debridement  of nails  1-5  B/L with a nail nipper.  Nails were then filed using a dremel tool with no incidents.   Upon debriding the right hallux nail,  The nail fragments were debrided exposing the nail bed.   This site was then bandaged with neosporin/DSD.   Call the office if this nail worsens.   Gardiner Barefoot DPM

## 2021-03-14 ENCOUNTER — Other Ambulatory Visit: Payer: Self-pay

## 2021-03-14 MED ORDER — LOSARTAN POTASSIUM-HCTZ 50-12.5 MG PO TABS: 1.0000 | ORAL_TABLET | Freq: Every day | ORAL | 0 refills | Status: DC

## 2021-03-14 NOTE — Telephone Encounter (Signed)
losartan-hydrochlorothiazide (HYZAAR) 50-12.5 MG tablet(Expired, refill request @  [facility], [address] Phone:  [phone]  Fax:  [phone]

## 2021-03-25 ENCOUNTER — Telehealth: Payer: Self-pay | Admitting: *Deleted
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
## 2021-03-26 ENCOUNTER — Ambulatory Visit (INDEPENDENT_AMBULATORY_CARE_PROVIDER_SITE_OTHER): Payer: Medicare Other | Admitting: Internal Medicine

## 2021-03-26 DIAGNOSIS — W19XXXA Unspecified fall, initial encounter: Secondary | ICD-10-CM

## 2021-03-26 DIAGNOSIS — C61 Malignant neoplasm of prostate: Secondary | ICD-10-CM
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
## 2021-03-26 NOTE — Patient Instructions (Signed)
Mr. [name],  It was a pleasure to see you today. Thank you for coming in.   Today we discussed your recent fall. I am sorry that this happened. Your history and neurological exam today is reassuring that this was likely just a fall from you waking up out of your sleep. In terms of your injuries, please continue to keep the areas clean, they should heal up with no issues. If the wounds start to open more or do not start healing please let us know.  We also discussed your blood pressure. This looks good today. Please continue your current medications.    We also discussed your prostate issues. Please follow up with the urologist early next month.   Please return to clinic in 3 months or sooner if needed.   Thank you again for coming in.   Asencion Noble.D.

## 2021-03-27 DIAGNOSIS — W19XXXA Unspecified fall, initial encounter: Secondary | ICD-10-CM | POA: Insufficient documentation

## 2021-03-27 NOTE — Assessment & Plan Note (Signed)
Patient has a follow-up with Dr. Louis Meckel (his urologist) on June 10.

## 2021-03-27 NOTE — Assessment & Plan Note (Signed)
Patient reported that yesterday evening he was sitting in his lawn chair and watching TV when he thinks he fell asleep and notes when he woke up he was on the floor next to the refrigerator and he had hit his right forehead and had a cut on his left hand.  This was unwitnessed.  He does not remember what happens prior to him falling asleep, he does not recall having any chest pain, shortness of breath, palpitations, headaches, weakness, or other issues.  He does remember getting up and he said his head was hurting and he was having some bleeding, he put some over the counter skin cleaner on the area and covered it with a bandage.  He denied any bowel or bladder incontinence, tongue biting, or confusion that he noted.  His fiance got home later that day and he was already at a friend's house.  She did not notice any memory or behavioral changes at that time.  She does note that he will often jerk awake from his sleep and thinks that this may be what caused his fall.  On exam patient has 2 lacerations, 1 on the right forehead and 1 on the left fourth digit, no evidence of infection noted.  He is awake, alert, oriented x3, cranial nerves intact, no focal neurological deficits noted.  There is no tongue bites noted.  Patient is not on any blood thinners at this time.  Overall findings do not seem to be consistent with a syncopal episode in neurological exam is intact, likely secondary to rapid awakening from sleep.  His lacerations are small and do not seem to require stitching, advised patient to continue to use bandages and keep the area clean, and to contact us if this worsens or does not improve, may need to get stitching if this occurs.  -Advised to keep lacerations clean and bandaged -Advised to contact us if fall or has any syncopal episodes

## 2021-03-27 NOTE — Progress Notes (Signed)
Internal Medicine Clinic Attending ° °Case discussed with Dr. Krienke  At the time of the visit.  We reviewed the resident’s history and exam and pertinent patient test results.  I agree with the assessment, diagnosis, and plan of care documented in the resident’s note.  °

## 2021-05-07 ENCOUNTER — Encounter: Payer: Self-pay | Admitting: Podiatry

## 2021-05-07 ENCOUNTER — Other Ambulatory Visit: Payer: Self-pay

## 2021-05-07 ENCOUNTER — Ambulatory Visit (INDEPENDENT_AMBULATORY_CARE_PROVIDER_SITE_OTHER): Payer: Medicare Other | Admitting: Podiatry

## 2021-05-07 DIAGNOSIS — B351 Tinea unguium: Secondary | ICD-10-CM | POA: Diagnosis not present

## 2021-05-07 NOTE — Progress Notes (Signed)
This patient presents  to the office for evaluation and treatment of long thick painful nails both feet. .  This patient is unable to trim his own nails since the patient cannot reach the feet.  Patient says the nails are painful walking and wearing his shoes. He says his big toenail right foot is broken down without any drainage or infection noted.Marland Kitchen   He presents  for preventive foot care services.  General Appearance  Alert, conversant and in no acute stress.  Vascular  Dorsalis pedis and posterior tibial  pulses are palpable right foot.  Dorsalis pedis and posterior tibial pulses are absent left foot.  .  Capillary return is within normal limits  bilaterally. Temperature is within normal limits  bilaterally.  Neurologic  Senn-Weinstein monofilament wire test diminished   bilaterally. Muscle power within normal limits bilaterally.  Nails Thick disfigured discolored nails with subungual debris  from hallux to fifth toes bilaterally. No evidence of bacterial infection or drainage bilaterally. Fragmented disfigured right hallux.    Orthopedic  No limitations of motion  feet .  No crepitus or effusions noted.  No bony pathology or digital deformities noted.  HAV  B/L.    Skin  normotropic skin with no porokeratosis noted bilaterally.  No signs of infections or ulcers noted.     Onychomycosis  Pain in toes right foot  Pain in toes left foot  Paronychia right hallux  Debridement  of nails  1-5  B/L with a nail nipper.  Nails were then filed using a dremel tool with no incidents.   Upon debriding the right hallux nail,  The nail fragments were debrided exposing the nail bed.   This site was then bandaged with neosporin/DSD.   Call the office if this nail worsens.   Gardiner Barefoot DPM

## 2021-06-04 ENCOUNTER — Other Ambulatory Visit: Payer: Self-pay | Admitting: *Deleted

## 2021-06-04 DIAGNOSIS — E785 Hyperlipidemia, unspecified: Secondary | ICD-10-CM

## 2021-06-04 MED ORDER — PRAVASTATIN SODIUM 20 MG PO TABS: ORAL_TABLET | ORAL | 1 refills | Status: DC

## 2021-06-10 ENCOUNTER — Telehealth: Payer: Self-pay

## 2021-06-10 NOTE — Telephone Encounter (Signed)
amLODipine (NORVASC) 10 MG tablet  losartan-hydrochlorothiazide (HYZAAR) 50-12.5 MG tablet  pravastatin (PRAVACHOL) 20 MG tablet, REFILL REQUEST @ [place], [place] - [address] AT [facility]

## 2021-06-10 NOTE — Telephone Encounter (Signed)
Pravastatin has already been sent to pharmacy via surescripts w/ receipt confirmation.  NOV 06/12/21 w/ Dr. Marianna Payment LOV was 03/26/21, patient was seen for fall, b/p 120/58 Patient's b/p on 12/12/20 was 115/54, it was noted on office visit's notes, if diastolic continues to run below 60, consider lowering b/p medication.  TC to patients (EC) [name], she was informed that since patient has an appt in 2 days, we will wait to refill until appt time when we can check his b/p.  She states he has enough medication to last until the appt and voices appreciation. Will forward to red team. Laurence Compton, RN,BSN

## 2021-06-11 NOTE — Telephone Encounter (Signed)
I agree. I will evaluate him and adjust his blood pressure medications accordingly. Thank you for letting me know.

## 2021-06-12 ENCOUNTER — Ambulatory Visit (INDEPENDENT_AMBULATORY_CARE_PROVIDER_SITE_OTHER): Payer: 59 | Admitting: Student

## 2021-06-12 VITALS — BP 142/61 | HR 62 | Wt 165.4 lb

## 2021-06-12 DIAGNOSIS — C61 Malignant neoplasm of prostate: Secondary | ICD-10-CM | POA: Diagnosis not present

## 2021-06-12 DIAGNOSIS — R0781 Pleurodynia: Secondary | ICD-10-CM | POA: Diagnosis not present

## 2021-06-12 DIAGNOSIS — M25551 Pain in right hip: Secondary | ICD-10-CM | POA: Diagnosis not present

## 2021-06-12 DIAGNOSIS — I10 Essential (primary) hypertension: Secondary | ICD-10-CM | POA: Diagnosis not present

## 2021-06-12 MED ORDER — LOSARTAN POTASSIUM-HCTZ 50-12.5 MG PO TABS: 1.0000 | ORAL_TABLET | Freq: Every day | ORAL | 0 refills | Status: DC

## 2021-06-12 MED ORDER — AMLODIPINE BESYLATE 10 MG PO TABS: 10.0000 mg | ORAL_TABLET | Freq: Every day | ORAL | 2 refills | Status: DC
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
## 2021-06-12 NOTE — Assessment & Plan Note (Signed)
Assessment: Current regimen of losartan-HCTZ 50-12.5 mg daily, amlodipine 10 mg daily.  Patient denies episodes of lightheadedness or dizziness.  Discussed checking home blood pressure readings and letting staff know if they are low.  Plan: -Continue Hyzaar and amlodipine

## 2021-06-12 NOTE — Patient Instructions (Signed)
Thank you, Masahiro Nissim for allowing [place] to provide your care today. Today we discussed   High blood pressure Please continue to take your blood pressure medications.  If you find her self feeling lightheaded or dizzy please call our clinic.  I recommend getting a blood pressure cuff and checking her pressures at home.  Right sided back/hip pain I will be ordering x-rays of your right hip as well as right rib.  I have ordered the following labs for you:  Lab Orders  No laboratory test(s) ordered today     Referrals ordered today:   Referral Orders  No referral(s) requested today     I have ordered the following medication/changed the following medications:   Stop the following medications: Medications Discontinued During This Encounter  Medication Reason   amLODipine (NORVASC) 10 MG tablet Reorder   losartan-hydrochlorothiazide (HYZAAR) 50-12.5 MG tablet Reorder     Start the following medications: Meds ordered this encounter  Medications   losartan-hydrochlorothiazide (HYZAAR) 50-12.5 MG tablet    Sig: Take 1 tablet by mouth daily.    Dispense:  90 tablet    Refill:  0   amLODipine (NORVASC) 10 MG tablet    Sig: Take 1 tablet (10 mg total) by mouth daily.    Dispense:  90 tablet    Refill:  2     Follow up: 4-6 months    Remember: I will call you for follow-up of your x-ray images  Should you have any questions or concerns please call the internal medicine clinic at [phone].     Sanjuana Letters, D.O. [place]

## 2021-06-17 ENCOUNTER — Ambulatory Visit (HOSPITAL_COMMUNITY)
Admission: RE | Admit: 2021-06-17 | Discharge: 2021-06-17 | Disposition: A | Discharge status: Home / Self Care | Payer: 59 | Source: Ambulatory Visit | Attending: Internal Medicine | Admitting: Internal Medicine

## 2021-06-17 ENCOUNTER — Other Ambulatory Visit: Payer: Self-pay | Admitting: Student

## 2021-06-17 ENCOUNTER — Other Ambulatory Visit: Payer: Self-pay

## 2021-06-17 DIAGNOSIS — R0781 Pleurodynia: Secondary | ICD-10-CM | POA: Insufficient documentation

## 2021-06-17 DIAGNOSIS — M25551 Pain in right hip: Secondary | ICD-10-CM | POA: Diagnosis present

## 2021-06-24 ENCOUNTER — Ambulatory Visit (HOSPITAL_COMMUNITY)
Admission: EM | Admit: 2021-06-24 | Discharge: 2021-06-24 | Disposition: A | Discharge status: Home / Self Care | Payer: 59 | Attending: Student | Admitting: Student

## 2021-06-24 ENCOUNTER — Encounter (HOSPITAL_COMMUNITY): Payer: Self-pay

## 2021-06-24 ENCOUNTER — Ambulatory Visit (INDEPENDENT_AMBULATORY_CARE_PROVIDER_SITE_OTHER): Payer: 59

## 2021-06-24 ENCOUNTER — Other Ambulatory Visit: Payer: Self-pay

## 2021-06-24 DIAGNOSIS — U071 COVID-19: Secondary | ICD-10-CM | POA: Insufficient documentation

## 2021-06-24 DIAGNOSIS — J9811 Atelectasis: Secondary | ICD-10-CM

## 2021-06-24 DIAGNOSIS — R0602 Shortness of breath: Secondary | ICD-10-CM

## 2021-06-24 DIAGNOSIS — Z8546 Personal history of malignant neoplasm of prostate: Secondary | ICD-10-CM | POA: Diagnosis not present

## 2021-06-24 DIAGNOSIS — N3000 Acute cystitis without hematuria: Secondary | ICD-10-CM | POA: Diagnosis present

## 2021-06-24 DIAGNOSIS — R509 Fever, unspecified: Secondary | ICD-10-CM

## 2021-06-24 LAB — POCT URINALYSIS DIPSTICK, ED / UC
Bilirubin Urine: NEGATIVE
Glucose, UA: NEGATIVE mg/dL
Hgb urine dipstick: NEGATIVE
Ketones, ur: NEGATIVE mg/dL
Nitrite: NEGATIVE
Protein, ur: 30 mg/dL — AB
Specific Gravity, Urine: 1.015 (ref 1.005–1.030)
Urobilinogen, UA: 0.2 mg/dL (ref 0.0–1.0)
pH: 7 (ref 5.0–8.0)

## 2021-06-24 LAB — CBG MONITORING, ED: Glucose-Capillary: 116 mg/dL — ABNORMAL HIGH (ref 70–99)

## 2021-06-24 MED ORDER — CIPROFLOXACIN HCL 500 MG PO TABS: 500.0000 mg | ORAL_TABLET | Freq: Two times a day (BID) | ORAL | 0 refills | Status: AC

## 2021-06-24 MED ORDER — ACETAMINOPHEN 325 MG PO TABS
650.0000 mg | ORAL_TABLET | Freq: Once | ORAL | Status: AC
  Administered 2021-06-24: 650 mg via ORAL

## 2021-06-24 MED ORDER — ACETAMINOPHEN 325 MG PO TABS
ORAL_TABLET | ORAL | Status: AC
  Filled 2021-06-24: qty 2

## 2021-06-24 NOTE — Discharge Instructions (Addendum)
-  Ciprofloxacin twice daily x7 days -Please follow-up with PCP for recheck in 3-5 days.  -Incentive spirometry for lungs -If symptoms are getting worse, including fevers, pain, weakness, new shortness of breath, dizziness, chest pain-had straight to the emergency department or call 911.
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
## 2021-06-24 NOTE — ED Notes (Signed)
Practiced incentive spirometry prior to leaving

## 2021-06-24 NOTE — ED Triage Notes (Signed)
Pt in with c/o weakness, urinary frequency that ws noticed today. Pt also c/o right hip pain  Pt caregiver states he has been sleeping more and not taking medications or eating

## 2021-06-25 LAB — SARS CORONAVIRUS 2 (TAT 6-24 HRS): SARS Coronavirus 2: POSITIVE — AB

## 2021-06-26 LAB — URINE CULTURE: Culture: <10000 — AB

## 2021-07-03 NOTE — Progress Notes (Signed)
Internal Medicine Clinic Attending  Case discussed with Dr. Katsadouros  At the time of the visit.  We reviewed the resident's history and exam and pertinent patient test results.  I agree with the assessment, diagnosis, and plan of care documented in the resident's note.  

## 2021-08-01 ENCOUNTER — Other Ambulatory Visit: Payer: Self-pay | Admitting: Internal Medicine

## 2021-08-01 DIAGNOSIS — Z8546 Personal history of malignant neoplasm of prostate: Secondary | ICD-10-CM

## 2021-08-04 ENCOUNTER — Encounter: Payer: Self-pay | Admitting: *Deleted

## 2021-08-04 IMAGING — CT NM PET TUM IMG SKULL BASE T - THIGH
1 of 7 series · 1 of 25 positions shown · non-contrast
Comparison: 01/19/2012 chest CT from [HOSPITAL]. 01/14/2012
abdominopelvic CT from [HOSPITAL].
COMPARISON: 01/19/2012 chest CT from [HOSPITAL]. 01/14/2012
abdominopelvic CT from [HOSPITAL].

Addendum:
CLINICAL DATA: Recurrent prostate cancer, status post prostate
surgery. Recent PSA of 1.76.

EXAM:
NUCLEAR MEDICINE PET SKULL BASE TO THIGH
TECHNIQUE: 8.0 mCi F18 Piflufolastat (Pylarify) was injected intravenously.
Full-ring PET imaging was performed from the skull base to thigh
after the radiotracer. CT data was obtained and used for attenuation
correction and anatomic localization.

[Series 4: ct sk_thigh 5.0 bf37 · axial · 5.0mm · 0.95mm/px · 1 of 230 slices shown]
[im 230/230  brain]
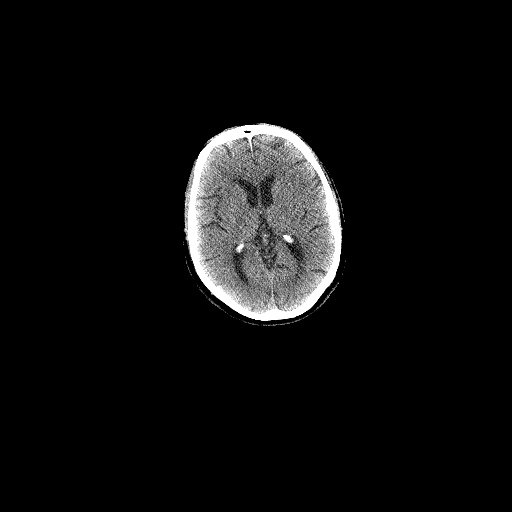

[1 of 25 positions shown; findings below may reference images not displayed]

FINDINGS: NECK

No areas of abnormal tracer accumulation.

Incidental CT finding: Bilateral carotid atherosclerosis. No
cervical adenopathy.

CHEST

Subcarinal node measures 6 mm and a S.U.V. max of 4.0 on 75/4. This
may have minimally enlarged from 5 mm on 01/19/2012.

An irregular right lower lobe pulmonary nodule measures 1.5 cm cm
and a S.U.V. max of 2.9 on 41/8. New since 4122.

Incidental CT finding: Aortic and coronary artery atherosclerosis.
Mild cardiomegaly.

Mild centrilobular emphysema.

ABDOMEN/PELVIS

Prostate: Mild tracer uptake within the right seminal vesicle,
including at a S.U.V. max of 5.3 on 175/4. No focal uptake within
the prostate in the setting of prior radiation seeds.

Lymph nodes: No abnormal radiotracer accumulation within pelvic or
abdominal nodes. Bilateral pelvic hypermetabolism is favored to be
ureteric.

Liver: No evidence of liver metastasis

Incidental CT finding: 7 mm gallstone. Normal adrenal glands. Right
renal vascular calcification. Bilateral low-density renal lesions
which are likely cysts, maximally 4.5 cm on the left.

Abdominal aortic atherosclerosis. Large colonic stool burden. Small
fat containing left inguinal hernia.

SKELETON

Right-sided L5 sclerotic lesion of 1.0 cm and a S.U.V. max of
on 147/4.

A left-sided T9 sclerotic lesion measures 1.0 cm and a S.U.V. max of
5.3 on 88/4.

Advanced bilateral hip osteoarthritis.
IMPRESSION: 1. L5 and T9 osseous metastasis.
2. New right lower lobe 1.5 cm pulmonary nodule since 4122 with
tracer accumulation. Most likely isolated pulmonary metastasis. As
some primary bronchogenic carcinomas can demonstrate PSMA uptake,
consider tissue sampling.
3. Tracer uptake within a small subcarinal node is nonspecific and
may relate to an atypical distribution of nodal metastasis.
4. Incidental findings, including: aortic atherosclerosis
(ZBDRH-QPI.I), coronary artery atherosclerosis and emphysema
(ZBDRH-YSK.C). Cholelithiasis.

ADDENDUM:
Not included in the impression section is right sided seminal
vesicle hypermetabolism. Nonspecific but suspicious for residual or
locally recurrent disease.

These results will be called to the ordering clinician or
representative by the Radiologist Assistant, and communication
documented in the PACS or [REDACTED].

*** End of Addendum ***
FINDINGS: NECK

No areas of abnormal tracer accumulation.

Incidental CT finding: Bilateral carotid atherosclerosis. No
cervical adenopathy.

CHEST

Subcarinal node measures 6 mm and a S.U.V. max of 4.0 on 75/4. This
may have minimally enlarged from 5 mm on 01/19/2012.

An irregular right lower lobe pulmonary nodule measures 1.5 cm cm
and a S.U.V. max of 2.9 on 41/8. New since 4122.

Incidental CT finding: Aortic and coronary artery atherosclerosis.
Mild cardiomegaly.

Mild centrilobular emphysema.

ABDOMEN/PELVIS

Prostate: Mild tracer uptake within the right seminal vesicle,
including at a S.U.V. max of 5.3 on 175/4. No focal uptake within
the prostate in the setting of prior radiation seeds.

Lymph nodes: No abnormal radiotracer accumulation within pelvic or
abdominal nodes. Bilateral pelvic hypermetabolism is favored to be
ureteric.

Liver: No evidence of liver metastasis

Incidental CT finding: 7 mm gallstone. Normal adrenal glands. Right
renal vascular calcification. Bilateral low-density renal lesions
which are likely cysts, maximally 4.5 cm on the left.

Abdominal aortic atherosclerosis. Large colonic stool burden. Small
fat containing left inguinal hernia.

SKELETON

Right-sided L5 sclerotic lesion of 1.0 cm and a S.U.V. max of
on 147/4.

A left-sided T9 sclerotic lesion measures 1.0 cm and a S.U.V. max of
5.3 on 88/4.

Advanced bilateral hip osteoarthritis.
IMPRESSION: 1. L5 and T9 osseous metastasis.
2. New right lower lobe 1.5 cm pulmonary nodule since 4122 with
tracer accumulation. Most likely isolated pulmonary metastasis. As
some primary bronchogenic carcinomas can demonstrate PSMA uptake,
consider tissue sampling.
3. Tracer uptake within a small subcarinal node is nonspecific and
may relate to an atypical distribution of nodal metastasis.
4. Incidental findings, including: aortic atherosclerosis
(ZBDRH-QPI.I), coronary artery atherosclerosis and emphysema
(ZBDRH-YSK.C). Cholelithiasis.

## 2021-08-04 NOTE — Progress Notes (Unsigned)

## 2021-08-12 ENCOUNTER — Encounter: Payer: Self-pay | Admitting: *Deleted

## 2021-08-12 NOTE — Progress Notes (Unsigned)

## 2021-08-13 ENCOUNTER — Ambulatory Visit (INDEPENDENT_AMBULATORY_CARE_PROVIDER_SITE_OTHER): Payer: Medicare Other | Admitting: Podiatry

## 2021-08-13 ENCOUNTER — Other Ambulatory Visit: Payer: Self-pay

## 2021-08-13 DIAGNOSIS — M79675 Pain in left toe(s): Secondary | ICD-10-CM | POA: Diagnosis not present

## 2021-08-13 DIAGNOSIS — L84 Corns and callosities: Secondary | ICD-10-CM | POA: Diagnosis not present

## 2021-08-13 DIAGNOSIS — I739 Peripheral vascular disease, unspecified: Secondary | ICD-10-CM

## 2021-08-13 DIAGNOSIS — B351 Tinea unguium: Secondary | ICD-10-CM

## 2021-08-13 DIAGNOSIS — M79674 Pain in right toe(s): Secondary | ICD-10-CM | POA: Diagnosis not present

## 2021-08-13 NOTE — Patient Instructions (Signed)
Apply Betadine Ointment to right great toe nailbed once daily.  Apply toe separator between left great toe and left 2nd toe daily for interdigital corn.

## 2021-08-17 ENCOUNTER — Encounter: Payer: Self-pay | Admitting: Podiatry

## 2021-08-17 NOTE — Progress Notes (Signed)
  Subjective:  Patient ID: [name], male    DOB: 05-17-1935,  MRN: [id number]  [name] presents to clinic today for painful thick toenails that are difficult to trim. Pain interferes with ambulation. Aggravating factors include wearing enclosed shoe gear. Pain is relieved with periodic professional debridement.  Patient is accompanied by his significant other. They state he has thick fungal nail of right hallux.  PCP is Lacinda Axon, MD , and last visit was 08/05/2020.  Review of Systems: Negative except as noted in the HPI. Objective:   Constitutional Legacy [name] is a pleasant 86 y.o. African American male, WD, WN in NAD. AAO x 3.   Vascular Palpable DP pulse(s) right lower extremity Palpable PT pulse(s) right foot Nonpalpable DP pulse(s) left foot. Nonpalpable PT pulse(s) left foot. Pedal hair absent. Lower extremity skin temperature gradient within normal limits. No pain with calf compression b/l. No edema noted b/l lower extremities. No cyanosis or clubbing noted.  Neurologic Normal speech. Oriented to person, place, and time. Protective sensation diminished with 10g monofilament b/l. Vibratory sensation intact b/l.  Dermatologic Skin warm and supple b/l lower extremities. No open wounds b/l LE. No interdigital macerations b/l lower extremities. Toenails 2-5 bilaterally and L hallux elongated, discolored, dystrophic, thickened, and crumbly with subungual debris and tenderness to dorsal palpation. Right hallux nailplate with severely thickened nailplate with exuberant amount of crumbly fungal debirs..  Orthopedic: Normal muscle strength 5/5 to all lower extremity muscle groups bilaterally. Hallux valgus with bunion deformity noted b/l lower extremities.   Radiographs: None Assessment:   1. Onychomycosis   2. Corns   3. PAD (peripheral artery disease) ([place])    Plan:  Patient was evaluated and treated and all questions answered. Consent given for treatment as described  below: -Examined patient. -Right great toe nailplate debrided of excessive fungal debris. Light bleeding addressed with Lumican Hemostatic solution. Betadine Solution applied to nailbed. Patient instructed to apply Betadine Ointment to right great toe nailbed once daily. -Patient to continue soft, supportive shoe gear daily. -Toenails 2-5 bilaterally and L hallux debrided in length and girth without iatrogenic bleeding with sterile nail nipper and dremel.  -Corn(s) L 2nd toe pared utilizing sterile scalpel blade without complication or incident. Total number debrided=1. -Patient to report any pedal injuries to medical professional immediately. -Dispensed toe separator for left great toe. Apply every morning. Remove every evening. -Patient/POA to call should there be question/concern in the interim.  Return in about 3 months (around 11/13/2021).  Marzetta Board, DPM

## 2021-08-18 ENCOUNTER — Encounter: Payer: Self-pay | Admitting: Student

## 2021-08-18 NOTE — Progress Notes (Unsigned)
Things That May Be Affecting Your Health:  Alcohol X Hearing loss X Pain    Depression  Home Safety  Sexual Health   Diabetes  Lack of physical activity  Stress   Difficulty with daily activities  Loneliness  Tiredness   Drug use  Medicines  Tobacco use  X Falls  Motor Vehicle Safety  Weight   Food choices  Oral Health  Other    YOUR PERSONALIZED HEALTH PLAN : 1. Schedule your next subsequent Medicare Wellness visit in one year 2. Attend all of your regular appointments to address your medical issues 3. Complete the preventative screenings and services   Annual Wellness Visit   Medicare Covered Preventative Screenings and [place] Men and Women Who How Often Need? Date of Last Service Action  Abdominal Aortic Aneurysm Adults with AAA risk factors Once      Alcohol Misuse and Counseling All Adults Screening once a year if no alcohol misuse. Counseling up to 4 face to face sessions.     Bone Density Measurement  Adults at risk for osteoporosis Once every 2 yrs      Lipid Panel Z13.6 All adults without CV disease Once every 5 yrs       Colorectal Cancer  Stool sample or Colonoscopy All adults 97 and older  Once every year Every 10 years        Depression All Adults Once a year X Today   Diabetes Screening Blood glucose, post glucose load, or GTT Z13.1 All adults at risk Pre-diabetics Once per year Twice per year X  08/05/20 A1c was normal at 5.2   Diabetes  Self-Management Training All adults Diabetics 10 hrs first year; 2 hours subsequent years. Requires Copay     Glaucoma Diabetics Family history of glaucoma African Americans 28 yrs + Hispanic Americans 84 yrs + Annually - requires coppay      Hepatitis C Z72.89 or F19.20 High Risk for HCV Born between 1945 and 1965 Annually Once      HIV Z11.4 All adults based on risk Annually btw ages 65 & 72 regardless of risk Annually > 65 yrs if at increased risk      Lung Cancer Screening  Asymptomatic adults aged 48-77 with 30 pack yr history and current smoker OR quit within the last 15 yrs Annually Must have counseling and shared decision making documentation before first screen      Medical Nutrition Therapy Adults with  Diabetes Renal disease Kidney transplant within past 3 yrs 3 hours first year; 2 hours subsequent years     Obesity and Counseling All adults Screening once a year Counseling if BMI 30 or higher  Today   Tobacco Use Counseling Adults who use tobacco  Up to 8 visits in one year     Vaccines Z23 Hepatitis B Influenza  Pneumonia  Adults  Once Once every flu season Two different vaccines separated by one year X 06/02/21 Check to see if he has received his flu shot. Direct him to get it if he wants it.   Next Annual Wellness Visit People with Medicare Every year  Today     [place] Women Who How Often Need  Date of Last Service Action  Mammogram  Z12.31 Women over 40 One baseline ages 80-39. Annually ager 40 yrs+      Pap tests All women Annually if high risk. Every 2 yrs for normal risk women      Screening for cervical  cancer with  Pap (Z01.419 nl or Z01.411abnl) & HPV Z11.51 Women aged 92 to 30 Once every 5 yrs     Screening pelvic and breast exams All women Annually if high risk. Every 2 yrs for normal risk women     Sexually Transmitted Diseases Chlamydia Gonorrhea Syphilis All at risk adults Annually for non pregnant females at increased risk         [place] Men Who How Ofter Need  Date of Last Service Action  Prostate Cancer - DRE & PSA Men over 50 Annually.  DRE might require a copay.        Sexually Transmitted Diseases Syphilis All at risk adults Annually for men at increased risk      Health Maintenance List Health Maintenance  Topic Date Due   Zoster Vaccines- Shingrix (1 of 2) Never done   TETANUS/TDAP  12/20/2019   COVID-19 Vaccine (4 - Booster for Pfizer series) 01/21/2021   INFLUENZA  VACCINE  06/02/2021   HPV VACCINES  Aged Out

## 2021-09-11 ENCOUNTER — Other Ambulatory Visit: Payer: Self-pay | Admitting: Student

## 2021-09-16 ENCOUNTER — Encounter: Payer: Self-pay | Admitting: Student

## 2021-09-16 ENCOUNTER — Other Ambulatory Visit: Payer: Self-pay

## 2021-09-16 ENCOUNTER — Ambulatory Visit (INDEPENDENT_AMBULATORY_CARE_PROVIDER_SITE_OTHER): Payer: Medicare Other | Admitting: Student

## 2021-09-16 ENCOUNTER — Encounter: Payer: 59 | Admitting: Internal Medicine

## 2021-09-16 VITALS — BP 118/58 | HR 80 | Temp 97.8°F | Ht 71.0 in | Wt 160.0 lb

## 2021-09-16 DIAGNOSIS — C61 Malignant neoplasm of prostate: Secondary | ICD-10-CM

## 2021-09-16 DIAGNOSIS — G8929 Other chronic pain: Secondary | ICD-10-CM | POA: Diagnosis not present

## 2021-09-16 DIAGNOSIS — Z23 Encounter for immunization: Secondary | ICD-10-CM | POA: Diagnosis not present

## 2021-09-16 DIAGNOSIS — M1611 Unilateral primary osteoarthritis, right hip: Secondary | ICD-10-CM

## 2021-09-16 DIAGNOSIS — C7951 Secondary malignant neoplasm of bone: Secondary | ICD-10-CM | POA: Diagnosis not present

## 2021-09-16 DIAGNOSIS — E785 Hyperlipidemia, unspecified: Secondary | ICD-10-CM | POA: Diagnosis not present

## 2021-09-16 DIAGNOSIS — I10 Essential (primary) hypertension: Secondary | ICD-10-CM

## 2021-09-16 DIAGNOSIS — Z515 Encounter for palliative care: Secondary | ICD-10-CM

## 2021-09-16 MED ORDER — OXYCODONE-ACETAMINOPHEN 5-325 MG PO TABS: 1.0000 | ORAL_TABLET | Freq: Two times a day (BID) | ORAL | 0 refills | Status: DC | PRN

## 2021-09-16 MED ORDER — PRAVASTATIN SODIUM 20 MG PO TABS: ORAL_TABLET | ORAL | 1 refills | Status: DC
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
## 2021-09-16 NOTE — Patient Instructions (Signed)
Thank you, [name] for allowing [place] to provide your care today. Today we discussed .   History of Prostate Cancer Please continue to communicate with [place] new symptoms you have. If you would like to meet with the cancer doctors please let us know.   Pain We will start you on oxycodone-tylenol today. I will call you in a few days to see how you're doing. Please stop taking if you feel lightheaded, dizzy, nausea or vomiting.  Please start taking fiber supplements to help with constipation.    I have ordered the following labs for you:     Referrals ordered today:   Referral Orders  No referral(s) requested today     I have ordered the following medication/changed the following medications:   Stop the following medications: There are no discontinued medications.   Start the following medications: Meds ordered this encounter  Medications   oxyCODONE-acetaminophen (PERCOCET) 5-325 MG tablet    Sig: Take 1 tablet by mouth every 12 (twelve) hours as needed for severe pain.    Dispense:  30 tablet    Refill:  0     Follow up:  1 month with me     Should you have any questions or concerns please call the internal medicine clinic at [phone].    Sanjuana Letters, D.O. [place]

## 2021-09-16 NOTE — Assessment & Plan Note (Signed)
Assessment: Blood pressure currently stable on Hyzaar 5-12.5 mg daily and amlodipine 10 mg daily.  Blood pressure today 118/58.  Patient denies lightheadedness and is upon standing or with walking.  We will continue to monitor closely and take the medications as needed.  Plan: -Continue Hyzaar 50-12 0.5 daily, amlodipine 10 mg daily

## 2021-09-16 NOTE — Assessment & Plan Note (Signed)
Secondary to patient's age we discussed discontinuing his pravastatin however patient would like to continue with this.  We will continue pravastatin 20 mg daily

## 2021-09-16 NOTE — Assessment & Plan Note (Addendum)
Assessment: Degenerative changes of the right hip on prior x-ray.  Patient continues to have right hip pain.  He denies improvement with Tylenol.  Patient is very active walking daily and exercising.  Because of history of metastatic disease and degenerative right hip changes I believe he would benefit from pain management therapy.  We discussed risks and benefits patient would like to move forward with this to help control his pain since he is so functional.  Patient would not like to see an orthopedic surgeon, would not like surgical intervention moving forward.  Plan: -Start Percocet 5-3 25 twice daily as needed for pain  UDS appropriate at this time

## 2021-09-16 NOTE — Assessment & Plan Note (Addendum)
Assessment: History of prostate cancer status post radiation therapy in 2011.  He had repeat imaging 2022 which showed metastasis to the thoracic and lumbar vertebrae as well as a nodule in his right lower lung.  Patient states he never followed up with oncology since seeing me last in August and would not like to follow-up with them further.  He denies wanting treatment moving forward, we discussed possible surgical interventions as well as radiation and chemotherapy.  Patient denies wanting any more procedures moving forward.  Discussed with him that this may be life-threatening and could lead to death, patient knowledges understanding.  We talked about palliative care services helping discussed with him his overall goals of care and what he would like with his life.  States he will think about it.  His biggest concern today is his pain.  He has osteoarthritis of the right hip, we did imaging during his last visit and it did not show metastatic disease.  He also had a history of multiple right-sided rib fractures after a fall and is continuing to have pain here.  He would not like repeat imaging on any of this.  Straight leg test negative I do not suspect he is having spinal canal compression from his metastatic disease.   Because he does not want further interventions or imaging at this time we will start treatment with Percocet 5-3 25 to manage his pain.  We will start a pain contract with the patient secondary to his history of metastatic disease as well as osteoarthritis.  Discussed risks and benefits with the patient and he signed pain contract.  UDS performed today.  Plan: -Consider palliative care continue to have ongoing discussions with the patient -Continue ongoing discussions for metastatic management and for routine imaging to assess for metastatic disease -Start Percocet 5-325 bid prn for cancer pain -PDMP reviewed and appropriate

## 2021-09-16 NOTE — Addendum Note (Signed)
Addended by: Riesa Pope on: 09/16/2021 01:10 PM   Modules accepted: Orders

## 2021-09-19 NOTE — Progress Notes (Signed)
Internal Medicine Clinic Attending  Case discussed with Dr. Katsadouros  At the time of the visit.  We reviewed the resident's history and exam and pertinent patient test results.  I agree with the assessment, diagnosis, and plan of care documented in the resident's note.  

## 2021-09-22 LAB — TOXASSURE SELECT,+ANTIDEPR,UR

## 2021-10-09 ENCOUNTER — Ambulatory Visit (INDEPENDENT_AMBULATORY_CARE_PROVIDER_SITE_OTHER): Payer: Medicare Other | Admitting: Student

## 2021-10-09 ENCOUNTER — Encounter: Payer: Self-pay | Admitting: Student

## 2021-10-09 ENCOUNTER — Other Ambulatory Visit: Payer: Self-pay

## 2021-10-09 VITALS — BP 119/59 | HR 67 | Temp 97.5°F | Ht 71.0 in | Wt 162.5 lb

## 2021-10-09 DIAGNOSIS — C7951 Secondary malignant neoplasm of bone: Secondary | ICD-10-CM

## 2021-10-09 DIAGNOSIS — L299 Pruritus, unspecified: Secondary | ICD-10-CM

## 2021-10-09 DIAGNOSIS — C61 Malignant neoplasm of prostate: Secondary | ICD-10-CM | POA: Diagnosis not present
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
## 2021-10-09 NOTE — Patient Instructions (Signed)
Thank you, Philopateer Strine for allowing [place] to provide your care today. Today we discussed .  Prostate cancer Please continue to follow with your oncologist.  I will also place a referral to palliative care to discuss your wishes moving forward.  Itching Unclear source as to why her itching, but this possibly could be secondary to the weather changes and cold weather.  Sometimes this makes patient's skin dry out.  Please purchase CeraVe over-the-counter cream and apply after you dry yourself off very thoroughly after shower.  We also be doing some blood work to make sure that this is not the cancer causing this.  Please also take Benadryl as needed for the itching.  I have ordered the following labs for you:   Lab Orders         CMP14 + Anion Gap         CBC with Diff      Referrals ordered today:   Referral Orders  No referral(s) requested today     I have ordered the following medication/changed the following medications:   Stop the following medications: Medications Discontinued During This Encounter  Medication Reason   oxyCODONE-acetaminophen (PERCOCET) 5-325 MG tablet      Start the following medications: No orders of the defined types were placed in this encounter.    Follow up:  2 month follow up with PCP     Should you have any questions or concerns please call the internal medicine clinic at [phone].    Sanjuana Letters, D.O. [place]

## 2021-10-10 DIAGNOSIS — L299 Pruritus, unspecified: Secondary | ICD-10-CM | POA: Insufficient documentation

## 2021-10-10 LAB — CMP14 + ANION GAP
ALT: 17 IU/L (ref 0–44)
AST: 14 IU/L (ref 0–40)
Albumin/Globulin Ratio: 1.4 (ref 1.2–2.2)
Albumin: 4.2 g/dL (ref 3.6–4.6)
Alkaline Phosphatase: 63 IU/L (ref 44–121)
Anion Gap: 11 mmol/L (ref 10.0–18.0)
BUN/Creatinine Ratio: 18 (ref 10–24)
BUN: 22 mg/dL (ref 8–27)
Bilirubin Total: 0.4 mg/dL (ref 0.0–1.2)
CO2: 25 mmol/L (ref 20–29)
Calcium: 9.3 mg/dL (ref 8.6–10.2)
Chloride: 105 mmol/L (ref 96–106)
Creatinine, Ser: 1.25 mg/dL (ref 0.76–1.27)
Globulin, Total: 3 g/dL (ref 1.5–4.5)
Glucose: 86 mg/dL (ref 70–99)
Potassium: 4.6 mmol/L (ref 3.5–5.2)
Sodium: 141 mmol/L (ref 134–144)
Total Protein: 7.2 g/dL (ref 6.0–8.5)
eGFR: 57 mL/min/{1.73_m2} — ABNORMAL LOW (ref >59)

## 2021-10-10 LAB — CBC WITH DIFFERENTIAL/PLATELET
Basophils Absolute: 0 10*3/uL (ref 0.0–0.2)
Basos: 0 %
EOS (ABSOLUTE): 0.1 10*3/uL (ref 0.0–0.4)
Eos: 2 %
Hematocrit: 33.2 % — ABNORMAL LOW (ref 37.5–51.0)
Hemoglobin: 11.1 g/dL — ABNORMAL LOW (ref 13.0–17.7)
Immature Grans (Abs): 0 10*3/uL (ref 0.0–0.1)
Immature Granulocytes: 0 %
Lymphocytes Absolute: 1.5 10*3/uL (ref 0.7–3.1)
Lymphs: 29 %
MCH: 31.3 pg (ref 26.6–33.0)
MCHC: 33.4 g/dL (ref 31.5–35.7)
MCV: 94 fL (ref 79–97)
Monocytes Absolute: 0.7 10*3/uL (ref 0.1–0.9)
Monocytes: 13 %
Neutrophils Absolute: 2.8 10*3/uL (ref 1.4–7.0)
Neutrophils: 56 %
Platelets: 220 10*3/uL (ref 150–450)
RBC: 3.55 x10E6/uL — ABNORMAL LOW (ref 4.14–5.80)
RDW: 12.7 % (ref 11.6–15.4)
WBC: 5 10*3/uL (ref 3.4–10.8)

## 2021-10-10 NOTE — Assessment & Plan Note (Signed)
Patient has follow up with [facility] next month to further discuss teatment opportunities. In the past patient has not wanted to pursue further therapy. He is willing to have a telephone encounter with palliative care, I feel as though this will be helpful moving forward and establishing the patient's wishes.

## 2021-10-10 NOTE — Assessment & Plan Note (Addendum)
Assessment: Patient with diffuse itching for the past 5 days. Worse in the evening and after showering. Denies changes in diet, clothing, or any new medications. He has never had anything like this before.   Likely secondary to weather change and dry skin. Encouraged patient to use cerave OTC cream after showering and drying off thoroughly. Benadryl as needed for severe exacerbations.  Will also order CBC and CMP patient's history of metastatic prostate cancer.  No other signs or symptoms that this could be secondary to elevated bilirubin levels.    Plan: -cerave after showering -benadryl as needed -CMP/CBC unremarkable

## 2021-10-13 NOTE — Progress Notes (Signed)
Internal Medicine Clinic Attending  Case discussed with Dr. Katsadouros  At the time of the visit.  We reviewed the resident's history and exam and pertinent patient test results.  I agree with the assessment, diagnosis, and plan of care documented in the resident's note.  

## 2021-10-17 ENCOUNTER — Telehealth: Payer: Self-pay

## 2021-10-17 NOTE — Telephone Encounter (Signed)
Spoke with patient's SO [name] and scheduled an in-person Palliative Consult for 11/12/2021 @ 3:15PM. [name] requested the latest afternoon appointment due to her work schedule.   COVID screening was negative. No pets in home. Patient lives with [place].  Consent obtained; updated Outlook/Netsmart/Team List and Epic.   Family is aware they may be receiving a call from provider the day before or day of to confirm appointment.

## 2021-11-10 DIAGNOSIS — N281 Cyst of kidney, acquired: Secondary | ICD-10-CM | POA: Diagnosis not present

## 2021-11-10 DIAGNOSIS — K8020 Calculus of gallbladder without cholecystitis without obstruction: Secondary | ICD-10-CM | POA: Diagnosis not present

## 2021-11-10 DIAGNOSIS — J984 Other disorders of lung: Secondary | ICD-10-CM | POA: Diagnosis not present

## 2021-11-10 DIAGNOSIS — R918 Other nonspecific abnormal finding of lung field: Secondary | ICD-10-CM | POA: Diagnosis not present

## 2021-11-12 ENCOUNTER — Other Ambulatory Visit: Payer: Commercial Managed Care - HMO | Admitting: Hospice

## 2021-11-12 ENCOUNTER — Other Ambulatory Visit: Payer: Self-pay

## 2021-11-12 DIAGNOSIS — M1611 Unilateral primary osteoarthritis, right hip: Secondary | ICD-10-CM | POA: Diagnosis not present

## 2021-11-12 DIAGNOSIS — C7951 Secondary malignant neoplasm of bone: Secondary | ICD-10-CM

## 2021-11-12 DIAGNOSIS — Z8546 Personal history of malignant neoplasm of prostate: Secondary | ICD-10-CM

## 2021-11-12 DIAGNOSIS — Z515 Encounter for palliative care: Secondary | ICD-10-CM | POA: Diagnosis not present
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
## 2021-11-13 MED ORDER — TAMSULOSIN HCL 0.4 MG PO CAPS: ORAL_CAPSULE | ORAL | 3 refills | Status: DC

## 2021-11-14 DIAGNOSIS — C7951 Secondary malignant neoplasm of bone: Secondary | ICD-10-CM | POA: Diagnosis not present

## 2021-11-14 DIAGNOSIS — R911 Solitary pulmonary nodule: Secondary | ICD-10-CM | POA: Diagnosis not present
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
## 2021-11-19 ENCOUNTER — Encounter: Payer: Self-pay | Admitting: Podiatry

## 2021-11-19 ENCOUNTER — Other Ambulatory Visit: Payer: Self-pay

## 2021-11-19 ENCOUNTER — Ambulatory Visit (INDEPENDENT_AMBULATORY_CARE_PROVIDER_SITE_OTHER): Payer: Medicare Other | Admitting: Podiatry

## 2021-11-19 DIAGNOSIS — M79675 Pain in left toe(s): Secondary | ICD-10-CM

## 2021-11-19 DIAGNOSIS — M79674 Pain in right toe(s): Secondary | ICD-10-CM | POA: Diagnosis not present

## 2021-11-19 DIAGNOSIS — L84 Corns and callosities: Secondary | ICD-10-CM

## 2021-11-19 DIAGNOSIS — B351 Tinea unguium: Secondary | ICD-10-CM | POA: Diagnosis not present

## 2021-11-19 DIAGNOSIS — I739 Peripheral vascular disease, unspecified: Secondary | ICD-10-CM

## 2021-11-24 DIAGNOSIS — H04123 Dry eye syndrome of bilateral lacrimal glands: Secondary | ICD-10-CM | POA: Diagnosis not present

## 2021-11-24 DIAGNOSIS — H25813 Combined forms of age-related cataract, bilateral: Secondary | ICD-10-CM | POA: Diagnosis not present

## 2021-11-26 ENCOUNTER — Encounter: Payer: Self-pay | Admitting: Podiatry

## 2021-11-26 NOTE — Progress Notes (Signed)
Subjective: [name] is a 87 y.o. male patient seen today for follow up of  corn(s) , interdigitally left great toe,  left 2nd digit and painful thick toenails that are difficult to trim. Painful toenails interfere with ambulation. Aggravating factors include wearing enclosed shoe gear. Pain is relieved with periodic professional debridement. Painful corns are aggravated when weightbearing when wearing enclosed shoe gear. Pain is relieved with periodic professional debridement.  New problems reported today: None.  Patient is accompanied by his significant other on today's visit.  PCP is Lacinda Axon, MD. Last visit was: 08/05/2020.  Allergies: PCN (see comments under Allergy section).  Objective: Physical Exam  General: Patient is a pleasant 87 y.o. African American male WD, WN in NAD. AAO x 3.   Neurovascular Examination: CFT <3 seconds b/l LE. Palpable DP pulse(s) right lower extremity Palpable PT pulse(s) right lower extremity Diminished DP pulse(s) left lower extremity. Diminished PT pulse(s) left lower extremity. Pedal hair absent. No pain with calf compression b/l. No edema noted b/l LE. No ischemia or gangrene noted b/l LE. No cyanosis or clubbing noted b/l LE.  Protective sensation diminished with 10g monofilament b/l. Vibratory sensation intact b/l.  Dermatological:  Pedal integument with normal turgor, texture and tone BLE. No open wounds b/l LE. No interdigital macerations noted b/l LE. Toenails 2-5 bilaterally elongated, discolored, dystrophic, thickened, and crumbly with subungual debris and tenderness to dorsal palpation. Left hallux with moderate amount of nailbed fungal debris mixed with hyperkeratosis. No underlying abscess. Hyperkeratotic lesion(s) L hallux and L 2nd toe.  No erythema, no edema, no drainage, no fluctuance.  Musculoskeletal:  Muscle strength 5/5 to all lower extremity muscle groups bilaterally. No pain, crepitus or joint limitation noted with ROM  bilateral LE. HAV with bunion deformity noted b/l LE.  Assessment: 1. Pain due to onychomycosis of toenails of both feet   2. Corns   3. PAD (peripheral artery disease) (HCC)    Plan: -No new findings. No new orders. -Mycotic toenails 1-5 bilaterally were debrided in length and girth with sterile nail nippers and dremel without incident. -Corn(s) L hallux and L 2nd toe pared utilizing sterile scalpel blade without complication or incident. Total number debrided=2. -Discontinue Betadine to right great toe nailbed. -Continue toe separator (1st webspace left foot) to afftected digits daily for protection. -Patient/POA to call should there be question/concern in the interim.  Return in about 3 months (around 02/17/2022).  Marzetta Board, DPM

## 2021-11-27 ENCOUNTER — Other Ambulatory Visit: Payer: Self-pay

## 2021-11-27 DIAGNOSIS — E785 Hyperlipidemia, unspecified: Secondary | ICD-10-CM

## 2021-11-27 MED ORDER — PRAVASTATIN SODIUM 20 MG PO TABS: ORAL_TABLET | ORAL | 3 refills | Status: DC

## 2021-12-10 ENCOUNTER — Encounter: Payer: Self-pay | Admitting: Student

## 2021-12-10 ENCOUNTER — Ambulatory Visit (INDEPENDENT_AMBULATORY_CARE_PROVIDER_SITE_OTHER): Payer: Medicare Other | Admitting: Student

## 2021-12-10 DIAGNOSIS — L299 Pruritus, unspecified: Secondary | ICD-10-CM | POA: Diagnosis not present

## 2021-12-10 DIAGNOSIS — I10 Essential (primary) hypertension: Secondary | ICD-10-CM
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
## 2021-12-10 NOTE — Assessment & Plan Note (Signed)
Patient presented for a follow up on an itchy skin that was evaluated 2 months ago. Patient reports the itching stopped less than a week after the initial evaluation. He used the OTC cream that was recommended with good results. He does not have any acute concerns today. There is no rash or bruises on exam.

## 2021-12-10 NOTE — Assessment & Plan Note (Signed)
Patient's BP remain stable. He denies any dizziness, presyncope, blurry vision or headache. Spouse checks pt's BP at home. Re-emphasized to call clinic if his SBP starts dropping below 110 or if patient starts to get lightheaded. Kidney function stable from recent labs.   Vitals:   12/10/21 1603  BP: (!) 116/57   Plan --Continue amlodipine 10 and Hyzaar 50-12.5 daily

## 2021-12-10 NOTE — Patient Instructions (Signed)
Thank you, [name] for allowing [place] to provide your care today. Today we discussed your itching.  I am glad your symptoms have resolved completely. Continue to follow-up with your primary care doctor as needed.  My Chart Access: [email or web address]  Please follow-up in 3 months with your PCP or as needed  Please make sure to arrive 15 minutes prior to your next appointment. If you arrive late, you may be asked to reschedule.    We look forward to seeing you next time. Please call our clinic at [phone] if you have any questions or concerns. The best time to call is Monday-Friday from 9am-4pm, but there is someone available 24/7. If after hours or the weekend, call the main hospital number and ask for the Internal Medicine Resident On-Call. If you need medication refills, please notify your pharmacy one week in advance and they will send [place] a request.   Thank you for letting us take part in your care. Wishing you the best!  Lacinda Axon, MD 12/10/2021, 4:40 PM IM Resident, PGY-2 [name] 41:10

## 2021-12-11 NOTE — Addendum Note (Signed)
Addended by: Charise Killian on: 12/11/2021 12:19 PM   Modules accepted: Level of Service

## 2021-12-11 NOTE — Progress Notes (Addendum)
Internal Medicine Clinic Attending  Case discussed with Dr. Amponsah  At the time of the visit.  We reviewed the resident's history and exam and pertinent patient test results.  I agree with the assessment, diagnosis, and plan of care documented in the resident's note.  

## 2022-02-02 ENCOUNTER — Ambulatory Visit: Payer: Medicare Other | Admitting: Podiatry

## 2022-02-11 ENCOUNTER — Encounter: Payer: Self-pay | Admitting: Podiatry

## 2022-02-11 ENCOUNTER — Ambulatory Visit (INDEPENDENT_AMBULATORY_CARE_PROVIDER_SITE_OTHER): Payer: Medicare Other | Admitting: Podiatry

## 2022-02-11 DIAGNOSIS — L603 Nail dystrophy: Secondary | ICD-10-CM

## 2022-02-11 DIAGNOSIS — L03031 Cellulitis of right toe: Secondary | ICD-10-CM

## 2022-02-11 DIAGNOSIS — L602 Onychogryphosis: Secondary | ICD-10-CM | POA: Diagnosis not present

## 2022-02-11 DIAGNOSIS — B359 Dermatophytosis, unspecified: Secondary | ICD-10-CM | POA: Diagnosis not present

## 2022-02-11 DIAGNOSIS — L84 Corns and callosities: Secondary | ICD-10-CM | POA: Diagnosis not present

## 2022-02-11 DIAGNOSIS — B079 Viral wart, unspecified: Secondary | ICD-10-CM | POA: Diagnosis not present

## 2022-02-11 DIAGNOSIS — B351 Tinea unguium: Secondary | ICD-10-CM

## 2022-02-11 DIAGNOSIS — I739 Peripheral vascular disease, unspecified: Secondary | ICD-10-CM | POA: Diagnosis not present

## 2022-02-20 ENCOUNTER — Ambulatory Visit: Payer: Commercial Managed Care - HMO | Admitting: Podiatry

## 2022-02-21 NOTE — Progress Notes (Signed)
?  Subjective:  ?Patient ID: [name], male    DOB: [birth year],  MRN: [id number] ? ?[name] presents to clinic today for at risk foot care. Patient has h/o PAD and corn(s) left great toe and L 2nd toe and painful thick toenails that are difficult to trim. Painful toenails interfere with ambulation. Aggravating factors include wearing enclosed shoe gear. Pain is relieved with periodic professional debridement. Painful corns are aggravated when weightbearing when wearing enclosed shoe gear. Pain is relieved with periodic professional debridement. ? ?PCP is Lacinda Axon, MD , and last visit was December 10, 2021. ? ?Review of Systems: Negative except as noted in the HPI. ? ?Objective: No changes noted in today's physical examination. ? ?General: Patient is a pleasant 87 y.o. African American male WD, WN in NAD. AAO x 3.  ? ?Neurovascular Examination: ?CFT <3 seconds b/l LE. Palpable DP pulse(s) right lower extremity. Palpable PT pulse(s) right lower extremity. Diminished DP pulse(s) left lower extremity. Diminished PT pulse(s) left lower extremity. Pedal hair absent. No pain with calf compression b/l. No edema noted b/l LE. No ischemia or gangrene noted b/l LE. No cyanosis or clubbing noted b/l LE. ? ?Protective sensation diminished with 10g monofilament b/l. Vibratory sensation intact b/l. ? ?Dermatological:  ?Pedal integument with normal turgor, texture and tone BLE. No open wounds b/l LE. No interdigital macerations noted b/l LE. Toenails 2-5 bilaterally elongated, discolored, dystrophic, thickened, and crumbly with subungual debris and tenderness to dorsal palpation.  ? ? ? ? ? ? ? ? ? ? ? ? ? ?Left hallux with moderate amount of nailbed fungal debris mixed with hyperkeratosis. No underlying abscess.  ? ?Hyperkeratotic lesion(s) L hallux and L 2nd toe.  No erythema, no edema, no drainage, no fluctuance. ? ?Musculoskeletal:  ?Muscle strength 5/5 to all lower extremity muscle groups bilaterally. No pain,  crepitus or joint limitation noted with ROM bilateral LE. HAV with bunion deformity noted b/l LE. ? ?Assessment/Plan: ?1. Onychomycosis   ?2. Nail dystrophy   ?3. Verruca   ?4. Corns   ?5. PAD (peripheral artery disease) ([place])   ?  ?-Patient was evaluated and treated. All patient's and/or POA's questions/concerns answered on today's visit. ?-Nail sample sent for culture  to [facility] for Nail-ID culture. ?-Toenails 1-5 b/l were debrided in length and girth with sterile nail nippers and dremel without iatrogenic bleeding.  ?-Corn(s) L hallux and L 2nd toe pared utilizing sterile scalpel blade without complication or incident. Total number debrided=2. ?-Will discuss case with colleagues and call wife with plan going forward. She related understanding. ?-Continue toe separator for interdigital corns to left great toe and left 2nd diigt  to afftected digits daily for protection. ?-Patient/POA to call should there be question/concern in the interim.  ? ?Return in about 3 months (around 05/13/2022). ? ?Marzetta Board, DPM  ?

## 2022-02-25 ENCOUNTER — Other Ambulatory Visit: Payer: Self-pay | Admitting: Student

## 2022-03-02 DIAGNOSIS — J439 Emphysema, unspecified: Secondary | ICD-10-CM | POA: Diagnosis not present

## 2022-03-02 DIAGNOSIS — R911 Solitary pulmonary nodule: Secondary | ICD-10-CM | POA: Diagnosis not present

## 2022-03-02 DIAGNOSIS — I7121 Aneurysm of the ascending aorta, without rupture: Secondary | ICD-10-CM | POA: Diagnosis not present

## 2022-03-09 DIAGNOSIS — C7951 Secondary malignant neoplasm of bone: Secondary | ICD-10-CM | POA: Diagnosis not present

## 2022-03-09 DIAGNOSIS — R911 Solitary pulmonary nodule: Secondary | ICD-10-CM | POA: Diagnosis not present

## 2022-03-16 ENCOUNTER — Telehealth (INDEPENDENT_AMBULATORY_CARE_PROVIDER_SITE_OTHER): Payer: Medicare Other | Admitting: Podiatry

## 2022-03-16 ENCOUNTER — Ambulatory Visit (HOSPITAL_COMMUNITY)
Admission: EM | Admit: 2022-03-16 | Discharge: 2022-03-16 | Disposition: A | Discharge status: Home / Self Care | Payer: Medicare Other | Attending: Internal Medicine | Admitting: Internal Medicine

## 2022-03-16 DIAGNOSIS — N50811 Right testicular pain: Secondary | ICD-10-CM | POA: Diagnosis not present

## 2022-03-16 LAB — POCT URINALYSIS DIPSTICK, ED / UC
Bilirubin Urine: NEGATIVE
Glucose, UA: NEGATIVE mg/dL
Ketones, ur: NEGATIVE mg/dL
Nitrite: NEGATIVE
Protein, ur: 30 mg/dL — AB
Specific Gravity, Urine: 1.02 (ref 1.005–1.030)
Urobilinogen, UA: 0.2 mg/dL (ref 0.0–1.0)
pH: 7 (ref 5.0–8.0)

## 2022-03-16 MED ORDER — SULFAMETHOXAZOLE-TRIMETHOPRIM 800-160 MG PO TABS: 1.0000 | ORAL_TABLET | Freq: Two times a day (BID) | ORAL | 0 refills | Status: AC

## 2022-03-16 NOTE — ED Triage Notes (Signed)
Pt reports he testicle are sore x 3 days. He reports some back pain. ?

## 2022-03-16 NOTE — Discharge Instructions (Addendum)
Take antibiotic as prescribed  ?Call urology in the morning  ? ?

## 2022-03-16 NOTE — Telephone Encounter (Signed)
Phoned Ms. Orpah Clinton, signifcant other of Mr. [name] with fungal culture results. Explained I discussed with colleagues and biopsy of right great toe nailbed was recommended. Will have schedulers call Ms. Orpah Clinton and schedule nailbed biopsy of right great toe with Dr. Sherryle Lis at their convenience. Ms. Saralyn Pilar is in agreement. ?
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
## 2022-03-17 LAB — URINE CULTURE: Culture: 20000 — AB

## 2022-03-18 DIAGNOSIS — R8271 Bacteriuria: Secondary | ICD-10-CM | POA: Diagnosis not present

## 2022-03-18 DIAGNOSIS — N453 Epididymo-orchitis: Secondary | ICD-10-CM | POA: Diagnosis not present

## 2022-03-20 DIAGNOSIS — N453 Epididymo-orchitis: Secondary | ICD-10-CM | POA: Diagnosis not present

## 2022-03-24 ENCOUNTER — Encounter: Payer: Self-pay | Admitting: Podiatry

## 2022-03-24 ENCOUNTER — Ambulatory Visit (INDEPENDENT_AMBULATORY_CARE_PROVIDER_SITE_OTHER): Payer: Medicare Other

## 2022-03-24 ENCOUNTER — Ambulatory Visit (INDEPENDENT_AMBULATORY_CARE_PROVIDER_SITE_OTHER): Payer: Medicare Other | Admitting: Podiatry

## 2022-03-24 DIAGNOSIS — M899 Disorder of bone, unspecified: Secondary | ICD-10-CM

## 2022-03-24 DIAGNOSIS — D492 Neoplasm of unspecified behavior of bone, soft tissue, and skin: Secondary | ICD-10-CM | POA: Diagnosis not present

## 2022-03-24 DIAGNOSIS — R2241 Localized swelling, mass and lump, right lower limb: Secondary | ICD-10-CM | POA: Diagnosis not present

## 2022-03-24 DIAGNOSIS — L989 Disorder of the skin and subcutaneous tissue, unspecified: Secondary | ICD-10-CM | POA: Diagnosis not present

## 2022-03-24 DIAGNOSIS — L603 Nail dystrophy: Secondary | ICD-10-CM | POA: Diagnosis not present

## 2022-03-24 DIAGNOSIS — L858 Other specified epidermal thickening: Secondary | ICD-10-CM | POA: Diagnosis not present

## 2022-03-24 DIAGNOSIS — D1631 Benign neoplasm of short bones of right lower limb: Secondary | ICD-10-CM | POA: Diagnosis not present

## 2022-03-24 DIAGNOSIS — L03031 Cellulitis of right toe: Secondary | ICD-10-CM | POA: Diagnosis not present

## 2022-03-24 DIAGNOSIS — B351 Tinea unguium: Secondary | ICD-10-CM | POA: Diagnosis not present

## 2022-03-24 NOTE — Patient Instructions (Signed)
The dressing on for 2 days then remove and clean the toe with warm water and Dial soap, then apply Neosporin or triple antibiotic ointment and Band-Aid.  Do this daily until next visit

## 2022-03-25 NOTE — Progress Notes (Signed)
  Subjective:  Patient ID: [name], male    DOB: 02-Jul-1935,  MRN: [id number]  Chief Complaint  Patient presents with   Nail Problem       biospy of nail bed for toe fungus    87 y.o. male presents with the above complaint. History confirmed with patient.  Patient was referred to me by Dr. Elisha Ponder for evaluation of a recurrent severely friable and dystrophic right great toenail with a mass in the nailbed deep to this.  Says it is not painful but continues to grow back and always has recurrent crusting on shoe gear.  It comes back thicker and wider each time.  He does have a history of prostate cancer with bone metastases.  Objective:  Physical Exam: warm, good capillary refill, no trophic changes or ulcerative lesions, and normal DP and PT pulses.  Right Foot: Severely dystrophic friable fungal nail there is a verrucous appearing soft tissue deep to this       Radiographs: Multiple views x-ray of the right foot: Possible cystic area in distal phalanx noted on multiple views of x-ray, there is also a MIC sclerotic and lucency in the distal tibial diaphysis Assessment:   1. Subungual exostosis of great toe   2. Mass of skin of toe of right foot   3. 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 Benign neoplasm of short bone of right lower extremity      Plan:  Patient was evaluated and treated and all questions answered.  Reviewed today's radiographs and clinical exam findings with the patient and his family over in detail.  I discussed that biopsy of the tissue would be advisable at this point.  Following verbal consent a digital block was made with lidocaine and Marcaine.  The toe was prepped in an aseptic technique with Betadine and a tourniquet was applied.  The friable nail was removed and this was sent as a nail specimen to pathology.  A punch biopsy 3 mm in diameter was then used to obtain a core sample of the proximal nail bed and subcutaneous tissue.  This was sent  in formalin for pathology as well.  Given his history of metastatic prostate cancer and possible cysts or lesions noted on his plain film x-rays of also recommended MRI of his great toe and his distal tibia.  I will see him back in 3 weeks for reevaluation  Return in about 3 weeks (around 04/14/2022).

## 2022-04-02 ENCOUNTER — Other Ambulatory Visit: Payer: Medicare Other | Admitting: Hospice

## 2022-04-02 DIAGNOSIS — N50811 Right testicular pain: Secondary | ICD-10-CM

## 2022-04-02 DIAGNOSIS — Z515 Encounter for palliative care: Secondary | ICD-10-CM | POA: Diagnosis not present

## 2022-04-02 DIAGNOSIS — I10 Essential (primary) hypertension: Secondary | ICD-10-CM

## 2022-04-02 DIAGNOSIS — M1611 Unilateral primary osteoarthritis, right hip: Secondary | ICD-10-CM

## 2022-04-02 DIAGNOSIS — C7951 Secondary malignant neoplasm of bone: Secondary | ICD-10-CM | POA: Diagnosis not present

## 2022-04-02 DIAGNOSIS — C61 Malignant neoplasm of prostate: Secondary | ICD-10-CM
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
## 2022-04-06 ENCOUNTER — Ambulatory Visit (INDEPENDENT_AMBULATORY_CARE_PROVIDER_SITE_OTHER): Payer: Medicare Other | Admitting: Student

## 2022-04-06 VITALS — BP 157/79 | HR 65 | Temp 97.7°F | Ht 71.0 in | Wt 161.6 lb

## 2022-04-06 DIAGNOSIS — Z23 Encounter for immunization: Secondary | ICD-10-CM

## 2022-04-06 DIAGNOSIS — C61 Malignant neoplasm of prostate: Secondary | ICD-10-CM

## 2022-04-06 DIAGNOSIS — B351 Tinea unguium: Secondary | ICD-10-CM

## 2022-04-06 DIAGNOSIS — C7951 Secondary malignant neoplasm of bone: Secondary | ICD-10-CM

## 2022-04-06 DIAGNOSIS — Z87891 Personal history of nicotine dependence: Secondary | ICD-10-CM

## 2022-04-06 DIAGNOSIS — I10 Essential (primary) hypertension: Secondary | ICD-10-CM

## 2022-04-06 DIAGNOSIS — Z299 Encounter for prophylactic measures, unspecified: Secondary | ICD-10-CM

## 2022-04-06 NOTE — Patient Instructions (Addendum)
Thank you, Cristofher Livecchi for allowing [place] to provide your care today. Today we discussed your blood pressure and foot fungi.  For your blood pressure, continue taking blood pressure medication as prescribed.  I have ordered the following labs for you:  Lab Orders         BMP8+Anion Gap      I will call if any are abnormal. All of your labs can be accessed through "My Chart".   My Chart Access: [email or web address]  Please follow-up in 3 months  Please make sure to arrive 15 minutes prior to your next appointment. If you arrive late, you may be asked to reschedule.    We look forward to seeing you next time. Please call our clinic at [phone] if you have any questions or concerns. The best time to call is Monday-Friday from 9am-4pm, but there is someone available 24/7. If after hours or the weekend, call the main hospital number and ask for the Internal Medicine Resident On-Call. If you need medication refills, please notify your pharmacy one week in advance and they will send [place] a request.   Thank you for letting us take part in your care. Wishing you the best!  Lacinda Axon, MD 04/06/2022, 4:08 PM IM Resident, PGY-2 [name] 41:10
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
## 2022-04-07 LAB — BMP8+ANION GAP
Anion Gap: 14 mmol/L (ref 10.0–18.0)
BUN/Creatinine Ratio: 19 (ref 10–24)
BUN: 27 mg/dL (ref 8–27)
CO2: 23 mmol/L (ref 20–29)
Calcium: 9.3 mg/dL (ref 8.6–10.2)
Chloride: 103 mmol/L (ref 96–106)
Creatinine, Ser: 1.45 mg/dL — ABNORMAL HIGH (ref 0.76–1.27)
Glucose: 95 mg/dL (ref 70–99)
Potassium: 4.4 mmol/L (ref 3.5–5.2)
Sodium: 140 mmol/L (ref 134–144)
eGFR: 48 mL/min/{1.73_m2} — ABNORMAL LOW (ref >59)

## 2022-04-08 ENCOUNTER — Encounter: Payer: Self-pay | Admitting: Student

## 2022-04-08 NOTE — Assessment & Plan Note (Signed)
Received a Tdap vaccine today.

## 2022-04-08 NOTE — Assessment & Plan Note (Signed)
BP slightly elevated to SBP in the 130s. Spouse states patient is currently on amlodipine alone.  States she checks his blood pressure every other day and SBP has been mostly in the 120s. Patient denies any dizziness, blurry vision or headaches.  BMP showed mild AKI with creatinine of 1.45 but no electrolyte abnormalities.  Patient has been off his Hyzaar due to low BPs at home while on amlodipine and Hyzaar.  Plan: -Continue amlodipine 10 mg daily -BP and BMP check at next office visit

## 2022-04-08 NOTE — Assessment & Plan Note (Signed)
[facility] follows with [facility].  Patient currently followed by palliative care.  Goal is to maximize quality of life and symptom management.  According to spouse, patient has follow-up with urology on 6/8 for his testicular edema.

## 2022-04-08 NOTE — Assessment & Plan Note (Signed)
Patient follows with podiatry for recurrent severely friable and dystrophic right great toenail with a mass in the nailbed.  Patient saw podiatry last month for debridement and biopsy of proximal nail bed and subcutaneous tissue due to his history of metastatic prostate cancer. They plan to get MRI of the toe and distal tibia.  -MRI right great toe and right distal fibular/tibia scheduled for 6/10 -Follow-up with podiatry as scheduled on 6/13

## 2022-04-09 DIAGNOSIS — N453 Epididymo-orchitis: Secondary | ICD-10-CM | POA: Diagnosis not present

## 2022-04-09 NOTE — Progress Notes (Signed)
Internal Medicine Clinic Attending  Case discussed with Dr. Amponsah  At the time of the visit.  We reviewed the resident's history and exam and pertinent patient test results.  I agree with the assessment, diagnosis, and plan of care documented in the resident's note.  

## 2022-04-11 ENCOUNTER — Ambulatory Visit
Admission: RE | Admit: 2022-04-11 | Discharge: 2022-04-11 | Disposition: A | Discharge status: Home / Self Care | Payer: Medicaid Other | Source: Ambulatory Visit | Attending: Podiatry | Admitting: Podiatry

## 2022-04-11 DIAGNOSIS — M7989 Other specified soft tissue disorders: Secondary | ICD-10-CM | POA: Diagnosis not present

## 2022-04-11 DIAGNOSIS — C7951 Secondary malignant neoplasm of bone: Secondary | ICD-10-CM | POA: Diagnosis not present

## 2022-04-11 DIAGNOSIS — D1631 Benign neoplasm of short bones of right lower limb: Secondary | ICD-10-CM

## 2022-04-11 DIAGNOSIS — D492 Neoplasm of unspecified behavior of bone, soft tissue, and skin: Secondary | ICD-10-CM

## 2022-04-14 ENCOUNTER — Ambulatory Visit (INDEPENDENT_AMBULATORY_CARE_PROVIDER_SITE_OTHER): Payer: Medicare Other | Admitting: Podiatry

## 2022-04-14 DIAGNOSIS — D492 Neoplasm of unspecified behavior of bone, soft tissue, and skin: Secondary | ICD-10-CM

## 2022-04-14 DIAGNOSIS — M899 Disorder of bone, unspecified: Secondary | ICD-10-CM

## 2022-04-14 DIAGNOSIS — B351 Tinea unguium: Secondary | ICD-10-CM | POA: Diagnosis not present

## 2022-04-14 DIAGNOSIS — R2241 Localized swelling, mass and lump, right lower limb: Secondary | ICD-10-CM | POA: Diagnosis not present

## 2022-04-16 ENCOUNTER — Telehealth: Payer: Medicare Other | Admitting: Hospice

## 2022-04-16 DIAGNOSIS — M1611 Unilateral primary osteoarthritis, right hip: Secondary | ICD-10-CM

## 2022-04-16 DIAGNOSIS — Z515 Encounter for palliative care: Secondary | ICD-10-CM | POA: Diagnosis not present

## 2022-04-16 DIAGNOSIS — I10 Essential (primary) hypertension: Secondary | ICD-10-CM

## 2022-04-16 DIAGNOSIS — C7951 Secondary malignant neoplasm of bone: Secondary | ICD-10-CM | POA: Diagnosis not present

## 2022-04-16 DIAGNOSIS — C61 Malignant neoplasm of prostate: Secondary | ICD-10-CM

## 2022-04-16 DIAGNOSIS — N50811 Right testicular pain: Secondary | ICD-10-CM
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
## 2022-04-17 ENCOUNTER — Encounter: Payer: Self-pay | Admitting: Podiatry

## 2022-04-17 NOTE — Progress Notes (Signed)
Subjective:  Patient ID: [name], male    DOB: [birth year],  MRN: [id number]  Chief Complaint  Patient presents with   Nail Problem     3 week follow up subungual exostosis    87 y.o. male presents with the above complaint. History confirmed with patient.  Patient was referred to me by Dr. Elisha Ponder for evaluation of a recurrent severely friable and dystrophic right great toenail with a mass in the nailbed deep to this.  Says it is not painful but continues to grow back and always has recurrent crusting on shoe gear.  It comes back thicker and wider each time.  He does have a history of prostate cancer with bone metastases.  Interval history: He has completed the MRI of the toenails feeling okay  Objective:  Physical Exam: warm, good capillary refill, no trophic changes or ulcerative lesions, and normal DP and PT pulses.  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 Given history of prostate cancer and known osseous metastases, could correlate with bone scan.   Mass-like soft tissue thickening along the dorsal aspect of the great toe distal phalanx measuring 2.3 x 1.0 x 2.8 cm, appearing to involve the toenail. No underlying bony involvement. MRI characteristics are nonspecific. Recommend biopsy for definitive tissue characterization.   Extensive soft tissue swelling of the lower extremities and right foot, can be seen lymphedema or cellulitis in the appropriate clinical setting. No well-defined/drainable fluid collection on noncontrast MRI. Mild generalized muscle atrophy.      Electronically Signed   By: Maurine Simmering M.D.   On: 04/13/2022 16:00  Radiographs: Multiple views x-ray of the right foot: Possible cystic area in distal phalanx noted on multiple views of x-ray, there is also a MIC sclerotic and lucency in the distal tibial diaphysis Assessment:   1. Subungual exostosis of great toe   2. Mass of skin of toe of right foot   3. 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 Onychomycosis      Plan:  Patient was evaluated and treated and all questions answered.  Today we reviewed the results of the MRI as well as his biopsy results.  The nail biopsy shows significant fungal growth but no concerning lesions within the toenail or nailbed itself.  We discussed continuing routine regular debridements intermittently.  We also discussed the possibility of a permanent matricectomy if this did not improve.  Finally we reviewed the results of his MRI and within the toe there is no concerning cystic lesions but the distal tibial metaphysis shows a bone infarct but possibly could be metastasis.  He has known metastasis of his prostate cancer in his spine.  It I discussed with the patient and his family and it has been previously decided that he no longer wishes to proceed with any further treatment for his cancer.  Therefore I recommend there is no further diagnostic work-up or procedural intervention for the lesions noted on the MRI and the tibia itself either.  They will return to see me as needed.  I discussed with him if the nail becomes continually bothersome that I would be happy to provide and  perform a permanent matricectomy totally if we need it. Return if symptoms worsen or fail to improve.

## 2022-05-11 DIAGNOSIS — N453 Epididymo-orchitis: Secondary | ICD-10-CM | POA: Diagnosis not present

## 2022-05-26 ENCOUNTER — Encounter: Payer: Self-pay | Admitting: Podiatry

## 2022-05-26 ENCOUNTER — Ambulatory Visit (INDEPENDENT_AMBULATORY_CARE_PROVIDER_SITE_OTHER): Payer: Medicare Other | Admitting: Podiatry

## 2022-05-26 DIAGNOSIS — L84 Corns and callosities: Secondary | ICD-10-CM

## 2022-05-26 DIAGNOSIS — B351 Tinea unguium: Secondary | ICD-10-CM | POA: Diagnosis not present

## 2022-05-26 DIAGNOSIS — M79674 Pain in right toe(s): Secondary | ICD-10-CM

## 2022-05-26 DIAGNOSIS — L603 Nail dystrophy: Secondary | ICD-10-CM | POA: Diagnosis not present

## 2022-05-26 DIAGNOSIS — M79675 Pain in left toe(s): Secondary | ICD-10-CM

## 2022-05-26 DIAGNOSIS — I739 Peripheral vascular disease, unspecified: Secondary | ICD-10-CM

## 2022-05-31 NOTE — Progress Notes (Signed)
  Subjective:  Patient ID: [name], male    DOB: [birth year],  MRN: [id number]  [name] presents to clinic today for for at risk foot care. Patient has h/o PAD and corn(s) L hallux and L 2nd toe and painful thick toenails that are difficult to trim. Painful toenails interfere with ambulation. Aggravating factors include wearing enclosed shoe gear. Pain is relieved with periodic professional debridement. Painful corns are aggravated when weightbearing when wearing enclosed shoe gear. Pain is relieved with periodic professional debridement.  Patient is accompanied by his significant other, [name]. They voice no new pedal concerns on today's visit. [name] states [name] has not been using his toe separator for interdigital corns left great toe/2nd toe.  He was seen by Dr. Sherryle Lis and biopsy of left great toe nailbed was benign.  He is on palliative care for metastatic prostate cancer.  New problem(s): None.   PCP is Lacinda Axon, MD , and last visit was  April 06, 2022  Review of Systems: Negative except as noted in the HPI.  Objective: No changes noted in today's physical examination. General: Patient is a pleasant 87 y.o. African American male WD, WN in NAD. AAO x 3.   Neurovascular Examination: CFT <3 seconds b/l LE. Palpable DP pulse(s) right lower extremity. Palpable PT pulse(s) right lower extremity. Diminished DP pulse(s) left lower extremity. Diminished PT pulse(s) left lower extremity. Pedal hair absent. No pain with calf compression b/l. No edema noted b/l LE. No ischemia or gangrene noted b/l LE. No cyanosis or clubbing noted b/l LE.  Protective sensation diminished with 10g monofilament b/l. Vibratory sensation intact b/l.  Dermatological:  Pedal integument with normal turgor, texture and tone BLE. No open wounds b/l LE. No interdigital macerations noted b/l LE. Toenails 2-5 bilaterally elongated, discolored, dystrophic, thickened, and crumbly with  subungual debris and tenderness to dorsal palpation.   Left hallux with moderate amount of nailbed fungal debris mixed with hyperkeratosis. No underlying abscess.   Hyperkeratotic lesion(s) lateral IPJ L hallux and medial PIPJ L 2nd toe.  No erythema, no edema, no drainage, no fluctuance.  Musculoskeletal:  Muscle strength 5/5 to all lower extremity muscle groups bilaterally. No pain, crepitus or joint limitation noted with ROM bilateral LE. HAV with bunion deformity noted b/l LE.  Assessment/Plan: 1. Pain due to onychomycosis of toenails of both feet   2. Corns   3. Nail dystrophy   4. PAD (peripheral artery disease) ([place])      -Patient was evaluated and treated. All patient's and/or POA's questions/concerns answered on today's visit. -Patient to continue soft, supportive shoe gear daily. -Mycotic toenails 2-5 bilaterally and R hallux were debrided in length and girth with sterile nail nippers and dremel without iatrogenic bleeding. -Corn(s) lateral DIPJ of L hallux and medial PIPJ of L 2nd toe pared utilizing sterile scalpel blade without complication or incident. Total number debrided=2. -Patient/POA to call should there be question/concern in the interim.   Return in about 3 months (around 08/26/2022).  Marzetta Board, DPM

## 2022-06-12 ENCOUNTER — Other Ambulatory Visit: Payer: Self-pay | Admitting: *Deleted

## 2022-06-12 DIAGNOSIS — Z8546 Personal history of malignant neoplasm of prostate: Secondary | ICD-10-CM

## 2022-06-12 MED ORDER — TAMSULOSIN HCL 0.4 MG PO CAPS: ORAL_CAPSULE | ORAL | 3 refills | Status: DC

## 2022-06-12 NOTE — Telephone Encounter (Signed)
Next appt scheduled 8/16 with PCP.

## 2022-06-17 ENCOUNTER — Ambulatory Visit (INDEPENDENT_AMBULATORY_CARE_PROVIDER_SITE_OTHER): Payer: Medicare Other | Admitting: Student

## 2022-06-17 VITALS — BP 137/54 | HR 57 | Temp 98.2°F | Ht 71.0 in | Wt 160.5 lb

## 2022-06-17 DIAGNOSIS — N451 Epididymitis: Secondary | ICD-10-CM | POA: Diagnosis not present

## 2022-06-17 DIAGNOSIS — R300 Dysuria: Secondary | ICD-10-CM

## 2022-06-17 LAB — POCT URINALYSIS DIPSTICK
Bilirubin, UA: NEGATIVE
Blood, UA: NEGATIVE
Glucose, UA: NEGATIVE
Ketones, UA: NEGATIVE
Leukocytes, UA: NEGATIVE
Nitrite, UA: NEGATIVE
Protein, UA: POSITIVE — AB
Spec Grav, UA: >=1.03 — AB (ref 1.010–1.025)
Urobilinogen, UA: 1 E.U./dL
pH, UA: 5.5 (ref 5.0–8.0)

## 2022-06-17 MED ORDER — SULFAMETHOXAZOLE-TRIMETHOPRIM 800-160 MG PO TABS: 1.0000 | ORAL_TABLET | Freq: Two times a day (BID) | ORAL | 0 refills | Status: AC

## 2022-06-17 NOTE — Patient Instructions (Addendum)
Thank you, [name] for allowing [place] to provide your care today. Today we discussed your testicular pain and urinary symptoms. We think your symptoms are more consistent with epidymidis so we are prescribing you an antibiotic. Please follow up with your urologist if you symptoms worsen or does not improve after 2 weeks.     I have ordered the following labs for you:  Lab Orders         Culture, Urine         Urinalysis, Reflex Microscopic         POCT Urinalysis Dipstick (46659)      I will call if any are abnormal. All of your labs can be accessed through "My Chart".  I have ordered the following medication/changed the following medications:  Bactrim twice daily for 10 days  My Chart Access: [email or web address]  Please follow-up as needed  Please make sure to arrive 15 minutes prior to your next appointment. If you arrive late, you may be asked to reschedule.    We look forward to seeing you next time. Please call our clinic at [phone] if you have any questions or concerns. The best time to call is Monday-Friday from 9am-4pm, but there is someone available 24/7. If after hours or the weekend, call the main hospital number and ask for the Internal Medicine Resident On-Call. If you need medication refills, please notify your pharmacy one week in advance and they will send [place] a request.   Thank you for letting us take part in your care. Wishing you the best!  Lacinda Axon, MD 06/17/2022, 4:47 PM IM Resident, PGY-3 [name] 41:10
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
## 2022-06-18 LAB — MICROSCOPIC EXAMINATION
Bacteria, UA: NONE SEEN
Casts: NONE SEEN /lpf
RBC, Urine: NONE SEEN /hpf (ref 0–2)

## 2022-06-18 LAB — URINALYSIS, ROUTINE W REFLEX MICROSCOPIC
Bilirubin, UA: NEGATIVE
Glucose, UA: NEGATIVE
Nitrite, UA: NEGATIVE
RBC, UA: NEGATIVE
Specific Gravity, UA: 1.024 (ref 1.005–1.030)
Urobilinogen, Ur: 1 mg/dL (ref 0.2–1.0)
pH, UA: 5 (ref 5.0–7.5)

## 2022-06-19 ENCOUNTER — Encounter: Payer: Self-pay | Admitting: Student

## 2022-06-19 DIAGNOSIS — N451 Epididymitis: Secondary | ICD-10-CM | POA: Insufficient documentation

## 2022-06-19 HISTORY — DX: Epididymitis: N45.1

## 2022-06-19 LAB — URINE CULTURE

## 2022-06-19 NOTE — Assessment & Plan Note (Signed)
Patient with a history of metastatic prostate cancer to the bone who is followed by urology here for evaluation of worsening scrotal swelling and pain as well as some dysuria. Patient developed testicular swelling in early May and was evaluated at the urgent care in which he was given Bactrim to cover for possible UTI versus epididymitis.  Patient followed up with his urologist on June 8th, and at that time, her testicular swelling had improved so no further evaluation was done.  He was instructed to apply ice to his testicles to help with the swelling.  Patient and spouse here today slightly worsening of his testicular swelling. He has been taking Tylenol to help with the pain. Recently, he has also had some dysuria as well as occasional hematuria (which is not new). He denies any fevers, chills, nausea, vomiting or injury to his testicles. He is not sexually active. His pain is mild and tolerable. On exam, he does have moderately swollen testicles with some redness tenderness. The cremasteric reflex is negative. Testicular torsion unlikely with his presentation. Patient's symptoms likely secondary to epididymitis. Urine dipstick was negative however UA showed some WBCs and leukocytes but no bacteria. Urine culture positive for group B strep, unchanged from urine culture from 3 months ago, likely colonized.  Plan to treat patient's epididymitis with a longer course of Bactrim with instructions to follow-up with urology if symptoms does not improve after treatment or worsens.  Plan: -Start Bactrim DS twice daily for 10 days -Tylenol 1000 mg 3 times daily as needed for pain -Follow-up with urology if symptoms do not improve

## 2022-06-22 NOTE — Progress Notes (Signed)
Internal Medicine Clinic Attending  Case discussed with Dr. Amponsah  At the time of the visit.  We reviewed the resident's history and exam and pertinent patient test results.  I agree with the assessment, diagnosis, and plan of care documented in the resident's note.  

## 2022-06-30 DIAGNOSIS — N453 Epididymo-orchitis: Secondary | ICD-10-CM | POA: Diagnosis not present

## 2022-07-09 ENCOUNTER — Other Ambulatory Visit: Payer: Medicare Other | Admitting: Hospice

## 2022-07-16 ENCOUNTER — Other Ambulatory Visit: Payer: Medicare Other | Admitting: *Deleted

## 2022-07-20 ENCOUNTER — Other Ambulatory Visit: Payer: Medicare Other | Admitting: *Deleted

## 2022-07-20 DIAGNOSIS — Z515 Encounter for palliative care: Secondary | ICD-10-CM

## 2022-07-27 ENCOUNTER — Emergency Department (HOSPITAL_COMMUNITY)
Admission: EM | Admit: 2022-07-27 | Discharge: 2022-07-27 | Discharge status: Against Medical Advice | Payer: Medicare Other

## 2022-07-27 NOTE — ED Notes (Signed)
Pt decided to leave before being seen.

## 2022-07-28 NOTE — Progress Notes (Signed)
[facility] PALLIATIVE CARE RN NOTE  PATIENT NAME: [name] DOB: Jul 10, 1935 MRN: [id number]  PRIMARY CARE PROVIDER: Lacinda Axon, MD  RESPONSIBLE PARTY: Orpah Clinton (significant other) Acct ID - Guarantor Home Phone Work Phone Relationship Acct Type  [id number] [name], GENTLE [phone]  Self P/F     [address] [place] [address]    RN telephonic encounter completed with patient and his significant other [name]. Patient currently sitting outside on the porch. He denies pain at this time. He denies shortness of breath. He says his appetite is good and denies any difficulty swallowing. He and [name] report he is maintaining his weight and currently weighs 163 lbs. Denies dysphagia. Has follow up appointment with Oncology in December. They deny any needs at this time and knows to call with any questions, concerns or changes in condition. Palliative care team will continue to follow.   Daryl Eastern, RN BSN

## 2022-09-01 DIAGNOSIS — R911 Solitary pulmonary nodule: Secondary | ICD-10-CM | POA: Diagnosis not present

## 2022-09-01 DIAGNOSIS — R918 Other nonspecific abnormal finding of lung field: Secondary | ICD-10-CM | POA: Diagnosis not present

## 2022-09-02 ENCOUNTER — Ambulatory Visit (INDEPENDENT_AMBULATORY_CARE_PROVIDER_SITE_OTHER): Payer: Medicare Other | Admitting: Podiatry

## 2022-09-02 DIAGNOSIS — I739 Peripheral vascular disease, unspecified: Secondary | ICD-10-CM | POA: Diagnosis not present

## 2022-09-02 DIAGNOSIS — L84 Corns and callosities: Secondary | ICD-10-CM

## 2022-09-02 DIAGNOSIS — M79675 Pain in left toe(s): Secondary | ICD-10-CM

## 2022-09-02 DIAGNOSIS — M79674 Pain in right toe(s): Secondary | ICD-10-CM

## 2022-09-02 DIAGNOSIS — B351 Tinea unguium: Secondary | ICD-10-CM | POA: Diagnosis not present

## 2022-09-02 NOTE — Progress Notes (Signed)
  Subjective:  Patient ID: [name], male    DOB: [birth year],  MRN: [id number]  [name] presents to clinic today for for at risk foot care. Patient has h/o PAD and corn(s) left lower extremity and painful thick toenails that are difficult to trim. Painful toenails interfere with ambulation. Aggravating factors include wearing enclosed shoe gear. Pain is relieved with periodic professional debridement. Painful corns are aggravated when weightbearing when wearing enclosed shoe gear. Pain is relieved with periodic professional debridement.. Chief Complaint  Patient presents with   Nail Problem    Routine foot care PCP-[place] PCP VST-07/2022   New problem(s): None.   PCP is Lacinda Axon, MD , and last visit was June 17, 2022.  Review of Systems: Negative except as noted in the HPI.  Objective: No changes noted in today's physical examination.  [name] is a pleasant 87 y.o. male WD, WN in NAD. AAO x 3.  Neurovascular Examination: CFT <3 seconds b/l LE. Palpable DP pulse(s) right lower extremity. Palpable PT pulse(s) right lower extremity. Diminished DP pulse(s) left lower extremity. Diminished PT pulse(s) left lower extremity. Pedal hair absent. No pain with calf compression b/l. No edema noted b/l LE. No ischemia or gangrene noted b/l LE. No cyanosis or clubbing noted b/l LE.  Protective sensation diminished with 10g monofilament b/l. Vibratory sensation intact b/l.  Dermatological:  Pedal integument with normal turgor, texture and tone BLE. No open wounds b/l LE. No interdigital macerations noted b/l LE. Toenails 2-5 bilaterally elongated, discolored, dystrophic, thickened, and crumbly with subungual debris and tenderness to dorsal palpation.   Left hallux with moderate amount of nailbed fungal debris mixed with hyperkeratosis. No underlying abscess.   Hyperkeratotic lesion(s) lateral IPJ L hallux and medial PIPJ L 2nd toe.  No erythema, no edema, no drainage, no  fluctuance.  Musculoskeletal:  Muscle strength 5/5 to all lower extremity muscle groups bilaterally. No pain, crepitus or joint limitation noted with ROM bilateral LE. HAV with bunion deformity noted b/l LE.  Assessment/Plan: 1. Pain due to onychomycosis of toenails of both feet   2. Corns   3. PAD (peripheral artery disease) ([place])     No orders of the defined types were placed in this encounter.   -Patient's family member present. All questions/concerns addressed on today's visit. -Examined patient. -Continue supportive shoe gear daily. -Toenails 2-5 bilaterally and right great toe debrided in length and girth without iatrogenic bleeding with sterile nail nipper and dremel.  -Corn(s) L hallux and L 2nd toe pared utilizing sharp debridement with sterile blade without complication or incident. Total number debrided=2. -Dispensed tube foam. Apply to L hallux every morning. Remove every evening. -Patient/POA to call should there be question/concern in the interim.   Return in about 3 months (around 12/03/2022).  Marzetta Board, DPM

## 2022-09-06 ENCOUNTER — Encounter: Payer: Self-pay | Admitting: Podiatry

## 2022-09-08 DIAGNOSIS — R310 Gross hematuria: Secondary | ICD-10-CM | POA: Diagnosis not present

## 2022-09-08 DIAGNOSIS — R911 Solitary pulmonary nodule: Secondary | ICD-10-CM | POA: Diagnosis not present

## 2022-09-08 DIAGNOSIS — C7951 Secondary malignant neoplasm of bone: Secondary | ICD-10-CM | POA: Diagnosis not present

## 2022-10-14 DIAGNOSIS — N453 Epididymo-orchitis: Secondary | ICD-10-CM | POA: Diagnosis not present

## 2022-10-14 DIAGNOSIS — R310 Gross hematuria: Secondary | ICD-10-CM | POA: Diagnosis not present

## 2022-10-16 DIAGNOSIS — R310 Gross hematuria: Secondary | ICD-10-CM | POA: Diagnosis not present

## 2022-10-16 DIAGNOSIS — N453 Epididymo-orchitis: Secondary | ICD-10-CM | POA: Diagnosis not present

## 2022-11-23 ENCOUNTER — Other Ambulatory Visit: Payer: Self-pay

## 2022-11-23 DIAGNOSIS — E785 Hyperlipidemia, unspecified: Secondary | ICD-10-CM

## 2022-11-23 MED ORDER — PRAVASTATIN SODIUM 20 MG PO TABS: ORAL_TABLET | ORAL | 3 refills | Status: DC

## 2022-12-07 DIAGNOSIS — N453 Epididymo-orchitis: Secondary | ICD-10-CM | POA: Diagnosis not present

## 2022-12-07 DIAGNOSIS — R310 Gross hematuria: Secondary | ICD-10-CM | POA: Diagnosis not present

## 2022-12-07 DIAGNOSIS — C7951 Secondary malignant neoplasm of bone: Secondary | ICD-10-CM | POA: Diagnosis not present

## 2022-12-09 ENCOUNTER — Ambulatory Visit (INDEPENDENT_AMBULATORY_CARE_PROVIDER_SITE_OTHER): Payer: 59 | Admitting: Student

## 2022-12-09 ENCOUNTER — Encounter: Payer: Self-pay | Admitting: Student

## 2022-12-09 ENCOUNTER — Other Ambulatory Visit: Payer: Self-pay

## 2022-12-09 VITALS — BP 111/54 | HR 70 | Temp 97.4°F | Wt 153.0 lb

## 2022-12-09 DIAGNOSIS — E785 Hyperlipidemia, unspecified: Secondary | ICD-10-CM | POA: Diagnosis not present

## 2022-12-09 DIAGNOSIS — C61 Malignant neoplasm of prostate: Secondary | ICD-10-CM | POA: Diagnosis not present

## 2022-12-09 DIAGNOSIS — I739 Peripheral vascular disease, unspecified: Secondary | ICD-10-CM | POA: Insufficient documentation

## 2022-12-09 DIAGNOSIS — Z23 Encounter for immunization: Secondary | ICD-10-CM | POA: Diagnosis not present

## 2022-12-09 DIAGNOSIS — C7951 Secondary malignant neoplasm of bone: Secondary | ICD-10-CM | POA: Diagnosis not present

## 2022-12-09 DIAGNOSIS — Z87891 Personal history of nicotine dependence: Secondary | ICD-10-CM

## 2022-12-09 DIAGNOSIS — I10 Essential (primary) hypertension: Secondary | ICD-10-CM

## 2022-12-09 DIAGNOSIS — Z0000 Encounter for general adult medical examination without abnormal findings: Secondary | ICD-10-CM

## 2022-12-09 MED ORDER — AMLODIPINE BESYLATE 10 MG PO TABS: ORAL_TABLET | ORAL | 3 refills | Status: DC

## 2022-12-09 MED ORDER — PRAVASTATIN SODIUM 20 MG PO TABS: ORAL_TABLET | ORAL | 3 refills | Status: DC

## 2022-12-09 NOTE — Patient Instructions (Addendum)
Thank you, Rooney Swails for allowing [place] to provide your care today. Today we discussed your prostate cancer, blood pressure and medication refill.  Continue to follow-up with palliative care and oncology.  My Chart Access: [email or web address]  Please follow-up in 6 months or as needed  Please make sure to arrive 15 minutes prior to your next appointment. If you arrive late, you may be asked to reschedule.    We look forward to seeing you next time. Please call our clinic at [phone] if you have any questions or concerns. The best time to call is Monday-Friday from 9am-4pm, but there is someone available 24/7. If after hours or the weekend, call the main hospital number and ask for the Internal Medicine Resident On-Call. If you need medication refills, please notify your pharmacy one week in advance and they will send [place] a request.   Thank you for letting us take part in your care. Wishing you the best!  Lacinda Axon, MD 12/09/2022, 4:38 PM IM Resident, PGY-3 [name] 41:10
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
## 2022-12-10 ENCOUNTER — Encounter: Payer: Self-pay | Admitting: Student

## 2022-12-10 NOTE — Assessment & Plan Note (Signed)
Patient's BP is stable with SBP in the 110s. Diastolic slightly low at 54 today. Patient denies any dizziness, chest pain or blurry vision.  Here for refill on his amlodipine. -Refill amlodipine 10 mg daily

## 2022-12-10 NOTE — Assessment & Plan Note (Signed)
Patient here for refill on his pravastatin. -Refill pravastatin 20 mg daily

## 2022-12-10 NOTE — Assessment & Plan Note (Addendum)
Received the flu vaccine today

## 2022-12-10 NOTE — Assessment & Plan Note (Signed)
Patient with warm extremities with 1+ DP pulses in both lower extremities.  Sensation is intact in both lower extremities.  Here for refill on his pravastatin. -Refill pravastatin 20 mg daily

## 2022-12-10 NOTE — Assessment & Plan Note (Addendum)
Patient with a history of metastatic prostate cancer to the bone followed by urology here for refill of medications. Per spouse, patient saw oncology at [facility] on 2/5 and was given some medications to take however patient is not interested in taking any medications. He continues to be followed by palliative care in the outpatient for symptom management. His goal remains to maximize quality of life does not want any further interventions for his cancer.  States he is feeling well, denies any dysuria or urinary symptoms. -Continue tamsulosin 0.4 mg daily -Continue to follow-up palliative care, urology and oncology as needed

## 2022-12-14 ENCOUNTER — Other Ambulatory Visit: Payer: Self-pay

## 2022-12-14 DIAGNOSIS — Z8546 Personal history of malignant neoplasm of prostate: Secondary | ICD-10-CM

## 2022-12-16 NOTE — Progress Notes (Signed)
Internal Medicine Clinic Attending  Case discussed with the resident at the time of the visit.  We reviewed the resident's history and exam and pertinent patient test results.  I agree with the assessment, diagnosis, and plan of care documented in the resident's note.  

## 2022-12-17 MED ORDER — TAMSULOSIN HCL 0.4 MG PO CAPS: ORAL_CAPSULE | ORAL | 3 refills | Status: DC

## 2022-12-28 ENCOUNTER — Ambulatory Visit (INDEPENDENT_AMBULATORY_CARE_PROVIDER_SITE_OTHER): Payer: Medicare Other | Admitting: Podiatry

## 2022-12-28 ENCOUNTER — Encounter: Payer: Self-pay | Admitting: Podiatry

## 2022-12-28 VITALS — BP 138/62

## 2022-12-28 DIAGNOSIS — B351 Tinea unguium: Secondary | ICD-10-CM | POA: Diagnosis not present

## 2022-12-28 DIAGNOSIS — L84 Corns and callosities: Secondary | ICD-10-CM

## 2022-12-28 DIAGNOSIS — I739 Peripheral vascular disease, unspecified: Secondary | ICD-10-CM

## 2022-12-28 DIAGNOSIS — M79675 Pain in left toe(s): Secondary | ICD-10-CM

## 2022-12-28 DIAGNOSIS — M79674 Pain in right toe(s): Secondary | ICD-10-CM

## 2022-12-28 NOTE — Progress Notes (Unsigned)
  Subjective:  Patient ID: [name], male    DOB: [birth year],  MRN: [id number]  [name] presents to clinic today for {jgcomplaint:23593}  Chief Complaint  Patient presents with   Nail Problem    [facility] PCP-[place] PCP VST-10/2022   New problem(s): None. {jgcomplaint:23593}  PCP is Lacinda Axon, MD.  Allergies  Allergen Reactions   Penicillins Other (See Comments)    "Passed Out" Has patient had a PCN reaction causing immediate rash, facial/tongue/throat swelling, SOB or lightheadedness with hypotension: {Yes/No:30480221} Has patient had a PCN reaction causing severe rash involving mucus membranes or skin necrosis: {Yes/No:30480221} Has patient had a PCN reaction that required hospitalization {Yes/No:30480221} Has patient had a PCN reaction occurring within the last 10 years: {Yes/No:30480221}    Review of Systems: Negative except as noted in the HPI.  Objective: No changes noted in today's physical examination. Vitals:   12/28/22 1611  BP: 138/62   [name] is a pleasant 88 y.o. male {jgbodyhabitus:24098} AAO x 3. Neurovascular Examination: CFT <3 seconds b/l LE. Palpable DP pulse(s) right lower extremity. Palpable PT pulse(s) right lower extremity. Diminished DP pulse(s) left lower extremity. Diminished PT pulse(s) left lower extremity. Pedal hair absent. No pain with calf compression b/l. No edema noted b/l LE. No ischemia or gangrene noted b/l LE. No cyanosis or clubbing noted b/l LE.  Protective sensation diminished with 10g monofilament b/l. Vibratory sensation intact b/l.  Dermatological:  Pedal integument with normal turgor, texture and tone BLE. No open wounds b/l LE. No interdigital macerations noted b/l LE. Toenails 2-5 bilaterally elongated, discolored, dystrophic, thickened, and crumbly with subungual debris and tenderness to dorsal palpation.   Left hallux with moderate amount of nailbed fungal debris mixed with hyperkeratosis. No underlying  abscess.   Hyperkeratotic lesion(s) lateral IPJ L hallux and medial PIPJ L 2nd toe.  No erythema, no edema, no drainage, no fluctuance.  Musculoskeletal:  Muscle strength 5/5 to all lower extremity muscle groups bilaterally. No pain, crepitus or joint limitation noted with ROM bilateral LE. HAV with bunion deformity noted b/l LE.  Assessment/Plan: 1. Pain due to onychomycosis of toenails of both feet   2. Corns   3. PAD (peripheral artery disease) ([place])     No orders of the defined types were placed in this encounter.   None {Jgplan:23602::"-Patient/POA to call should there be question/concern in the interim."}   Return in about 3 months (around 03/28/2023).  Marzetta Board, DPM

## 2023-03-03 ENCOUNTER — Other Ambulatory Visit: Payer: Self-pay | Admitting: Student

## 2023-03-03 DIAGNOSIS — Z8546 Personal history of malignant neoplasm of prostate: Secondary | ICD-10-CM

## 2023-03-28 ENCOUNTER — Encounter: Payer: Self-pay | Admitting: *Deleted

## 2023-04-27 ENCOUNTER — Ambulatory Visit (INDEPENDENT_AMBULATORY_CARE_PROVIDER_SITE_OTHER): Payer: 59 | Admitting: Podiatry

## 2023-04-27 ENCOUNTER — Encounter: Payer: Self-pay | Admitting: Podiatry

## 2023-04-27 VITALS — BP 142/66 | HR 58

## 2023-04-27 DIAGNOSIS — B353 Tinea pedis: Secondary | ICD-10-CM

## 2023-04-27 DIAGNOSIS — L84 Corns and callosities: Secondary | ICD-10-CM | POA: Diagnosis not present

## 2023-04-27 DIAGNOSIS — B351 Tinea unguium: Secondary | ICD-10-CM | POA: Diagnosis not present

## 2023-04-27 DIAGNOSIS — I739 Peripheral vascular disease, unspecified: Secondary | ICD-10-CM

## 2023-04-27 DIAGNOSIS — M79675 Pain in left toe(s): Secondary | ICD-10-CM

## 2023-04-27 DIAGNOSIS — M79674 Pain in right toe(s): Secondary | ICD-10-CM

## 2023-04-27 MED ORDER — KETOCONAZOLE 2 % EX CREA: TOPICAL_CREAM | CUTANEOUS | 1 refills | Status: DC

## 2023-05-02 NOTE — Progress Notes (Signed)
Subjective:  Patient ID: [name], male    DOB: [birth year],  MRN: [id number]  Cejay Hoeksema presents to clinic today for at risk foot care. Patient has h/o PAD and corn(s) left foot and painful thick toenails that are difficult to trim. Painful toenails interfere with ambulation. Aggravating factors include wearing enclosed shoe gear. Pain is relieved with periodic professional debridement. Painful corns are aggravated when weightbearing when wearing enclosed shoe gear. Pain is relieved with periodic professional debridement.  Chief Complaint  Patient presents with   Nail Problem    "Clip these toenails."   New problem(s): None.   PCP is [name], MD.  Allergies  Allergen Reactions   Penicillins Other (See Comments)      Review of Systems: Negative except as noted in the HPI.  Objective:  Vitals:   04/27/23 1056  BP: (!) 142/66  Pulse: (!) 58   Geovanni Orlandi is a pleasant 88 y.o. male WD, WN in NAD. AAO x 3.  Neurovascular Examination: CFT <3 seconds b/l LE. Palpable DP pulse(s) right lower extremity. Palpable PT pulse(s) right lower extremity. Diminished DP pulse(s) left lower extremity. Diminished PT pulse(s) left lower extremity. Pedal hair absent. No pain with calf compression b/l. No edema noted b/l LE. No ischemia or gangrene noted b/l LE. No cyanosis or clubbing noted b/l LE.  Protective sensation diminished with 10g monofilament b/l. Vibratory sensation intact b/l.  Dermatological:  Pedal integument with normal turgor and tone BLE. No open wounds b/l LE. No interdigital macerations noted b/l LE. Toenails left hallux and  2-5 bilaterally elongated, discolored, dystrophic, thickened, and crumbly with subungual debris and tenderness to dorsal palpation.   Right hallux with moderate amount of nailbed fungal debris mixed with hyperkeratosis. No underlying abscess.   Hyperkeratotic lesion(s) lateral IPJ L hallux and medial PIPJ L 2nd toe.  No erythema, no edema, no  drainage, no fluctuance.  Diffuse scaling noted peripherally and plantarly b/l feet.  No interdigital macerations.  No blisters, no weeping. No signs of secondary bacterial infection noted.  Musculoskeletal:  Muscle strength 5/5 to all lower extremity muscle groups bilaterally. No pain, crepitus or joint limitation noted with ROM bilateral LE. HAV with bunion deformity noted b/l LE. Assessment/Plan: 1. Pain due to onychomycosis of toenails of both feet   2. Corns   3. Tinea pedis of both feet   4. PAD (peripheral artery disease) ([facility])     Meds ordered this encounter  Medications   ketoconazole (NIZORAL) 2 % cream    Sig: Apply to both feet and between toes once daily for 6 weeks.    Dispense:  60 g    Refill:  1   -Patient was evaluated and treated. All patient's and/or POA's questions/concerns answered on today's visit. -Patient to continue soft, supportive shoe gear daily. -Toenails were debrided in length and girth 2-5 bilaterally and L hallux with sterile nail nippers and dremel without iatrogenic bleeding.  -Toenails right great toe were debrided in length and girth with sterile nail nippers and dremel. Pinpoint bleeding of R hallux addressed with Lumicain Hemostatic Solution, cleansed with alcohol. Triple antibiotic ointment applied. Patient/careigver instructed to apply triple antibiotic ointment once daily for 7 days. -Corn(s) left great toe and L 2nd toe pared utilizing sterile scalpel blade without complication or incident. Total number debrided=2. -For tinea pedis, Rx sent to pharmacy for Ketoconazole Cream 2% to be applied once daily for six weeks. -Patient/POA to call should there be question/concern in the interim.  Return in about 3 months (around 07/28/2023).  Freddie Breech, DPM

## 2023-05-26 ENCOUNTER — Ambulatory Visit (INDEPENDENT_AMBULATORY_CARE_PROVIDER_SITE_OTHER): Payer: 59

## 2023-05-26 VITALS — BP 127/55 | HR 58 | Wt 152.3 lb

## 2023-05-26 DIAGNOSIS — Z0000 Encounter for general adult medical examination without abnormal findings: Secondary | ICD-10-CM

## 2023-05-26 NOTE — Patient Instructions (Signed)
[name] , Thank you for taking time to come for your Medicare Wellness Visit. I appreciate your ongoing commitment to your health goals. Please review the following plan we discussed and let me know if I can assist you in the future.   These are the goals we discussed:  Goals       continue to be able to walking 30-60 min (pt-stated)        This is a list of the screening recommended for you and due dates:  Health Maintenance  Topic Date Due   Zoster (Shingles) Vaccine (1 of 2) Never done   COVID-19 Vaccine (4 - 2023-24 season) 07/03/2022   Flu Shot  06/03/2023   DTaP/Tdap/Td vaccine (3 - Td or Tdap) 04/06/2032   Pneumonia Vaccine  Completed   HPV Vaccine  Aged Out    Advanced directives: Please bring a copy of your health care power of attorney and living will to the office to be added to your chart at your convenience.   Conditions/risks identified: Each day, aim for 6 glasses of water, plenty of protein in your diet and try to get up and walk/ stretch every hour for 5-10 minutes at a time.  Remember to ask your provider about recommending a new eye care doctor.  Next appointment: Follow up in one year for your annual wellness visit.   Preventive Care 18 Years and Older, Male  Preventive care refers to lifestyle choices and visits with your health care provider that can promote health and wellness. What does preventive care include? A yearly physical exam. This is also called an annual well check. Dental exams once or twice a year. Routine eye exams. Ask your health care provider how often you should have your eyes checked. Personal lifestyle choices, including: Daily care of your teeth and gums. Regular physical activity. Eating a healthy diet. Avoiding tobacco and drug use. Limiting alcohol use. Practicing safe sex. Taking low doses of aspirin every day. Taking vitamin and mineral supplements as recommended by your health care provider. What happens during an annual  well check? The services and screenings done by your health care provider during your annual well check will depend on your age, overall health, lifestyle risk factors, and family history of disease. Counseling  Your health care provider may ask you questions about your: Alcohol use. Tobacco use. Drug use. Emotional well-being. Home and relationship well-being. Sexual activity. Eating habits. History of falls. Memory and ability to understand (cognition). Work and work Astronomer. Screening  You may have the following tests or measurements: Height, weight, and BMI. Blood pressure. Lipid and cholesterol levels. These may be checked every 5 years, or more frequently if you are over 40 years old. Skin check. Lung cancer screening. You may have this screening every year starting at age 85 if you have a 30-pack-year history of smoking and currently smoke or have quit within the past 15 years. Fecal occult blood test (FOBT) of the stool. You may have this test every year starting at age 70. Flexible sigmoidoscopy or colonoscopy. You may have a sigmoidoscopy every 5 years or a colonoscopy every 10 years starting at age 79. Prostate cancer screening. Recommendations will vary depending on your family history and other risks. Hepatitis C blood test. Hepatitis B blood test. Sexually transmitted disease (STD) testing. Diabetes screening. This is done by checking your blood sugar (glucose) after you have not eaten for a while (fasting). You may have this done every 1-3 years. Abdominal aortic  aneurysm (AAA) screening. You may need this if you are a current or former smoker. Osteoporosis. You may be screened starting at age 60 if you are at high risk. Talk with your health care provider about your test results, treatment options, and if necessary, the need for more tests. Vaccines  Your health care provider may recommend certain vaccines, such as: Influenza vaccine. This is recommended every  year. Tetanus, diphtheria, and acellular pertussis (Tdap, Td) vaccine. You may need a Td booster every 10 years. Zoster vaccine. You may need this after age 25. Pneumococcal 13-valent conjugate (PCV13) vaccine. One dose is recommended after age 47. Pneumococcal polysaccharide (PPSV23) vaccine. One dose is recommended after age 38. Talk to your health care provider about which screenings and vaccines you need and how often you need them. This information is not intended to replace advice given to you by your health care provider. Make sure you discuss any questions you have with your health care provider. Document Released: 11/15/2015 Document Revised: 07/08/2016 Document Reviewed: 08/20/2015 Elsevier Interactive Patient Education  2017 [employer].  Fall Prevention in the Home Falls can cause injuries. They can happen to people of all ages. There are many things you can do to make your home safe and to help prevent falls. What can I do on the outside of my home? Regularly fix the edges of walkways and driveways and fix any cracks. Remove anything that might make you trip as you walk through a door, such as a raised step or threshold. Trim any bushes or trees on the path to your home. Use bright outdoor lighting. Clear any walking paths of anything that might make someone trip, such as rocks or tools. Regularly check to see if handrails are loose or broken. Make sure that both sides of any steps have handrails. Any raised decks and porches should have guardrails on the edges. Have any leaves, snow, or ice cleared regularly. Use sand or salt on walking paths during winter. Clean up any spills in your garage right away. This includes oil or grease spills. What can I do in the bathroom? Use night lights. Install grab bars by the toilet and in the tub and shower. Do not use towel bars as grab bars. Use non-skid mats or decals in the tub or shower. If you need to sit down in the shower, use a  plastic, non-slip stool. Keep the floor dry. Clean up any water that spills on the floor as soon as it happens. Remove soap buildup in the tub or shower regularly. Attach bath mats securely with double-sided non-slip rug tape. Do not have throw rugs and other things on the floor that can make you trip. What can I do in the bedroom? Use night lights. Make sure that you have a light by your bed that is easy to reach. Do not use any sheets or blankets that are too big for your bed. They should not hang down onto the floor. Have a firm chair that has side arms. You can use this for support while you get dressed. Do not have throw rugs and other things on the floor that can make you trip. What can I do in the kitchen? Clean up any spills right away. Avoid walking on wet floors. Keep items that you use a lot in easy-to-reach places. If you need to reach something above you, use a strong step stool that has a grab bar. Keep electrical cords out of the way. Do not use floor  polish or wax that makes floors slippery. If you must use wax, use non-skid floor wax. Do not have throw rugs and other things on the floor that can make you trip. What can I do with my stairs? Do not leave any items on the stairs. Make sure that there are handrails on both sides of the stairs and use them. Fix handrails that are broken or loose. Make sure that handrails are as long as the stairways. Check any carpeting to make sure that it is firmly attached to the stairs. Fix any carpet that is loose or worn. Avoid having throw rugs at the top or bottom of the stairs. If you do have throw rugs, attach them to the floor with carpet tape. Make sure that you have a light switch at the top of the stairs and the bottom of the stairs. If you do not have them, ask someone to add them for you. What else can I do to help prevent falls? Wear shoes that: Do not have high heels. Have rubber bottoms. Are comfortable and fit you  well. Are closed at the toe. Do not wear sandals. If you use a stepladder: Make sure that it is fully opened. Do not climb a closed stepladder. Make sure that both sides of the stepladder are locked into place. Ask someone to hold it for you, if possible. Clearly mark and make sure that you can see: Any grab bars or handrails. First and last steps. Where the edge of each step is. Use tools that help you move around (mobility aids) if they are needed. These include: Canes. Walkers. Scooters. Crutches. Turn on the lights when you go into a dark area. Replace any light bulbs as soon as they burn out. Set up your furniture so you have a clear path. Avoid moving your furniture around. If any of your floors are uneven, fix them. If there are any pets around you, be aware of where they are. Review your medicines with your doctor. Some medicines can make you feel dizzy. This can increase your chance of falling. Ask your doctor what other things that you can do to help prevent falls. This information is not intended to replace advice given to you by your health care provider. Make sure you discuss any questions you have with your health care provider. Document Released: 08/15/2009 Document Revised: 03/26/2016 Document Reviewed: 11/23/2014 Elsevier Interactive Patient Education  2017 [employer].
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
## 2023-06-29 ENCOUNTER — Encounter: Payer: 59 | Admitting: Internal Medicine

## 2023-06-30 ENCOUNTER — Ambulatory Visit (INDEPENDENT_AMBULATORY_CARE_PROVIDER_SITE_OTHER): Payer: 59 | Admitting: Internal Medicine

## 2023-06-30 ENCOUNTER — Encounter: Payer: Self-pay | Admitting: Internal Medicine

## 2023-06-30 VITALS — BP 124/65 | HR 65 | Temp 97.6°F | Ht 71.0 in | Wt 153.1 lb

## 2023-06-30 DIAGNOSIS — R7989 Other specified abnormal findings of blood chemistry: Secondary | ICD-10-CM | POA: Diagnosis not present

## 2023-06-30 DIAGNOSIS — C61 Malignant neoplasm of prostate: Secondary | ICD-10-CM

## 2023-06-30 DIAGNOSIS — Z23 Encounter for immunization: Secondary | ICD-10-CM

## 2023-06-30 DIAGNOSIS — I10 Essential (primary) hypertension: Secondary | ICD-10-CM

## 2023-06-30 DIAGNOSIS — C7951 Secondary malignant neoplasm of bone: Secondary | ICD-10-CM | POA: Diagnosis not present

## 2023-06-30 DIAGNOSIS — Z0000 Encounter for general adult medical examination without abnormal findings: Secondary | ICD-10-CM

## 2023-06-30 NOTE — Addendum Note (Signed)
Addended byCala Bradford on: 06/30/2023 02:29 PM   Modules accepted: Orders

## 2023-06-30 NOTE — Assessment & Plan Note (Signed)
Creatinine 1.45 at last visit  -check BMP today
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
## 2023-06-30 NOTE — Assessment & Plan Note (Signed)
Patient is not receiving treatment for metastatic prostate cancer. He is followed by palliative care team. He is feeling well, denies any depressive symptoms, and has no complaints today. He feels he is urinating an appropriate amount with his current Flomax regimen.   -continue tamsulosin 0.4 -continue following with palliative

## 2023-06-30 NOTE — Assessment & Plan Note (Signed)
Patient's BP is at goal today, 124/65. Continue current regimen of amlodipine 10 daily

## 2023-06-30 NOTE — Assessment & Plan Note (Signed)
Receiving flu shot today.

## 2023-06-30 NOTE — Addendum Note (Signed)
Addended by: Derrell Lolling on: 06/30/2023 02:31 PM   Modules accepted: Orders

## 2023-06-30 NOTE — Patient Instructions (Addendum)
[name]:   It was a pleasure meeting you today.  Today we discussed your medications, and I don't believe you need any medication adjustments at this time. I would like to check some routine blood work since you haven't had a basic metabolic panel done in some time. I'm glad you are feeling well and are physically active. Please send a message to the clinic if you have any other concerns or need any medication refills.   Thanks,  [name], MD

## 2023-07-01 NOTE — Progress Notes (Signed)
Internal Medicine Clinic Attending  I was physically present during the key portions of the resident provided service and participated in the medical decision making of patient's management care. I reviewed pertinent patient test results.  The assessment, diagnosis, and plan were formulated together and I agree with the documentation in the resident's note.  Lau, Grace, MD  

## 2023-07-01 NOTE — Addendum Note (Signed)
Addended by: Dickie La on: 07/01/2023 10:07 AM   Modules accepted: Level of Service

## 2023-07-02 LAB — BMP8+ANION GAP
Anion Gap: 12 mmol/L (ref 10.0–18.0)
BUN/Creatinine Ratio: 14 (ref 10–24)
BUN: 19 mg/dL (ref 8–27)
CO2: 24 mmol/L (ref 20–29)
Calcium: 9 mg/dL (ref 8.6–10.2)
Chloride: 105 mmol/L (ref 96–106)
Creatinine, Ser: 1.37 mg/dL — ABNORMAL HIGH (ref 0.76–1.27)
Glucose: 102 mg/dL — ABNORMAL HIGH (ref 70–99)
Potassium: 4 mmol/L (ref 3.5–5.2)
Sodium: 141 mmol/L (ref 134–144)
eGFR: 50 mL/min/{1.73_m2} — ABNORMAL LOW (ref >59)

## 2023-08-10 ENCOUNTER — Ambulatory Visit (INDEPENDENT_AMBULATORY_CARE_PROVIDER_SITE_OTHER): Payer: 59 | Admitting: Podiatry

## 2023-08-10 DIAGNOSIS — M79675 Pain in left toe(s): Secondary | ICD-10-CM

## 2023-08-10 DIAGNOSIS — B351 Tinea unguium: Secondary | ICD-10-CM | POA: Diagnosis not present

## 2023-08-10 DIAGNOSIS — I739 Peripheral vascular disease, unspecified: Secondary | ICD-10-CM | POA: Diagnosis not present

## 2023-08-10 DIAGNOSIS — M79674 Pain in right toe(s): Secondary | ICD-10-CM | POA: Diagnosis not present

## 2023-08-15 ENCOUNTER — Encounter: Payer: Self-pay | Admitting: Podiatry

## 2023-08-15 NOTE — Progress Notes (Signed)
Subjective:  Patient ID: [name], male    DOB: 15-Mar-1935,  MRN: [id number]  [name] presents to clinic today for at risk foot care. Patient has h/o PAD and painful elongated overgrown toenails which are tender when wearing enclosed shoe gear.   His significant other is present during today's visit.  New problem(s): None.   PCP is [name], MD.  ALL: PCN  Review of Systems: Negative except as noted in the HPI.  Objective: No changes noted in today's physical examination. There were no vitals filed for this visit. [name] is a pleasant 88 y.o. male WD, WN in NAD. AAO x 3.  Neurovascular Examination: CFT <3 seconds b/l LE. Palpable DP pulse(s) right lower extremity. Palpable PT pulse(s) right lower extremity. Diminished DP pulse(s) left lower extremity. Diminished PT pulse(s) left lower extremity. Pedal hair absent. No pain with calf compression b/l. No edema noted b/l LE. No ischemia or gangrene noted b/l LE. No cyanosis or clubbing noted b/l LE.  Protective sensation diminished with 10g monofilament b/l. Vibratory sensation intact b/l.  Dermatological:  Pedal integument with normal turgor and tone BLE. No open wounds b/l LE. No interdigital macerations noted b/l LE. Toenails left hallux and  2-5 bilaterally elongated, discolored, dystrophic, thickened, and crumbly with subungual debris and tenderness to dorsal palpation.   Right hallux with moderate amount of nailbed fungal debris mixed with hyperkeratosis. No underlying abscess.   Musculoskeletal:  Muscle strength 5/5 to all lower extremity muscle groups bilaterally. No pain, crepitus or joint limitation noted with ROM bilateral LE. HAV with bunion deformity noted b/l LE.  Assessment/Plan: 1. Pain due to onychomycosis of toenails of both feet   2. PAD (peripheral artery disease) (HCC)     -Consent given for treatment as described below: -Examined patient. -Toenails debrided by Lottie Rater, PMA. -Patient to  continue soft, supportive shoe gear daily. -Toenails 1-5 b/l were debrided in length and girth with sterile nail nippers and dremel without iatrogenic bleeding.  -Patient/POA to call should there be question/concern in the interim.   Return in about 3 months (around 11/10/2023).  Freddie Breech, DPM

## 2023-08-23 ENCOUNTER — Other Ambulatory Visit: Payer: Self-pay

## 2023-08-23 DIAGNOSIS — Z8546 Personal history of malignant neoplasm of prostate: Secondary | ICD-10-CM

## 2023-08-23 MED ORDER — TAMSULOSIN HCL 0.4 MG PO CAPS
ORAL_CAPSULE | ORAL | 3 refills | Status: DC
Start: 2023-08-23 — End: 2024-08-28

## 2023-08-25 ENCOUNTER — Other Ambulatory Visit: Payer: Self-pay

## 2023-08-25 MED ORDER — AMLODIPINE BESYLATE 10 MG PO TABS: ORAL_TABLET | ORAL | 3 refills | Status: DC

## 2023-12-14 ENCOUNTER — Ambulatory Visit (INDEPENDENT_AMBULATORY_CARE_PROVIDER_SITE_OTHER): Payer: 59 | Admitting: Podiatry

## 2023-12-14 ENCOUNTER — Encounter: Payer: Self-pay | Admitting: Podiatry

## 2023-12-14 VITALS — Ht 71.0 in | Wt 153.0 lb

## 2023-12-14 DIAGNOSIS — M79674 Pain in right toe(s): Secondary | ICD-10-CM

## 2023-12-14 DIAGNOSIS — B351 Tinea unguium: Secondary | ICD-10-CM | POA: Diagnosis not present

## 2023-12-14 DIAGNOSIS — I739 Peripheral vascular disease, unspecified: Secondary | ICD-10-CM

## 2023-12-14 DIAGNOSIS — M79675 Pain in left toe(s): Secondary | ICD-10-CM

## 2023-12-21 ENCOUNTER — Encounter: Payer: Self-pay | Admitting: Podiatry

## 2023-12-21 NOTE — Progress Notes (Signed)
  Subjective:  Patient ID: [name], male    DOB: 22-Feb-1935,  MRN: [id number]  [name] presents to clinic today for at risk foot care. Patient has h/o PAD and painful, elongated thickened toenails x 10 which are symptomatic when wearing enclosed shoe gear. This interferes with his/her daily activities. He is accompanied by his significant other, [name], on today's visit. He has known deformed nailplate left hallux which has been biopsied and found to be benign. He does have known malignancy of prostate. Chief Complaint  Patient presents with   RFC    " The fungus is eating the toe up" he is here for a nail trim, PCP is Dr. Carlynn Purl and seen last year,    New problem(s): None.   PCP is [name], MD.  Allergies  Allergen Reactions   Penicillins Other (See Comments)    "Passed Out"   Review of Systems: Negative except as noted in the HPI.  Objective: No changes noted in today's physical examination. There were no vitals filed for this visit. [name] is a pleasant 89 y.o. male WD, WN in NAD. AAO x 3.  Neurovascular Examination: CFT <3 seconds b/l LE. Palpable DP pulse(s) right lower extremity. Palpable PT pulse(s) right lower extremity. Diminished DP pulse(s) left lower extremity. Diminished PT pulse(s) left lower extremity. Pedal hair absent. No pain with calf compression b/l. No edema noted b/l LE. No ischemia or gangrene noted b/l LE. No cyanosis or clubbing noted b/l LE.  Protective sensation diminished with 10g monofilament b/l. Vibratory sensation intact b/l.  Dermatological:  Pedal integument with normal turgor and tone BLE. No open wounds b/l LE. No interdigital macerations noted b/l LE. Toenails left hallux and  2-5 bilaterally elongated, discolored, dystrophic, thickened, and crumbly with subungual debris and tenderness to dorsal palpation.   Right hallux with moderate amount of nailbed fungal debris mixed with hyperkeratosis. No underlying abscess.    Musculoskeletal:  Muscle strength 5/5 to all lower extremity muscle groups bilaterally. No pain, crepitus or joint limitation noted with ROM bilateral LE. HAV with bunion deformity noted b/l LE.  Assessment/Plan: 1. Pain due to onychomycosis of toenails of both feet   2. PAD (peripheral artery disease) (HCC)     Patient was evaluated and treated. All patient's and/or POA's questions/concerns addressed on today's visit. Mycotic toenails 1-5 debrided in length and girth without incident. Continue soft, supportive shoe gear daily. Report any pedal injuries to medical professional. Call office if there are any quesitons/concerns. -Patient/POA to call should there be question/concern in the interim.   Return in about 4 months (around 04/12/2024).  [name], DPM      [facility] LOCATION: [address] [place] [address]                   Office [phone]   [facility] LOCATION: [address] [place], [place] [address] Office [phone]

## 2024-02-17 ENCOUNTER — Other Ambulatory Visit: Payer: Self-pay

## 2024-02-17 DIAGNOSIS — E785 Hyperlipidemia, unspecified: Secondary | ICD-10-CM

## 2024-02-17 MED ORDER — AMLODIPINE BESYLATE 10 MG PO TABS: ORAL_TABLET | ORAL | 3 refills | Status: AC

## 2024-02-17 MED ORDER — PRAVASTATIN SODIUM 20 MG PO TABS
ORAL_TABLET | ORAL | 3 refills | Status: AC
Start: 2024-02-17 — End: ?

## 2024-02-17 NOTE — Addendum Note (Signed)
 Addended by: Amadou Katzenstein on: 02/17/2024 10:24 AM   Modules accepted: Orders

## 2024-02-17 NOTE — Telephone Encounter (Signed)
 Medication sent to pharmacy

## 2024-03-23 ENCOUNTER — Other Ambulatory Visit: Payer: Self-pay

## 2024-03-23 ENCOUNTER — Other Ambulatory Visit: Payer: Self-pay | Admitting: Internal Medicine

## 2024-03-23 ENCOUNTER — Encounter: Payer: Self-pay | Admitting: Student

## 2024-03-23 ENCOUNTER — Ambulatory Visit (INDEPENDENT_AMBULATORY_CARE_PROVIDER_SITE_OTHER): Payer: Self-pay | Admitting: Student

## 2024-03-23 VITALS — BP 138/52 | HR 58 | Temp 97.5°F | Ht 71.5 in | Wt 155.2 lb

## 2024-03-23 DIAGNOSIS — I10 Essential (primary) hypertension: Secondary | ICD-10-CM

## 2024-03-23 DIAGNOSIS — C61 Malignant neoplasm of prostate: Secondary | ICD-10-CM | POA: Diagnosis not present

## 2024-03-23 DIAGNOSIS — Z7189 Other specified counseling: Secondary | ICD-10-CM

## 2024-03-23 DIAGNOSIS — C7951 Secondary malignant neoplasm of bone: Secondary | ICD-10-CM

## 2024-03-23 NOTE — Assessment & Plan Note (Signed)
 Patient has not been receiving treatment for metastatic prostate cancer.  He has been followed by the palliative care team but stopped going.  No clear indication why but patient states that he preferred not to follow-up with them.  He denies any changes in mood.  No changes in his other signs or symptoms.  Counseled on if he were to choose to follow-up with them again that we be happy to send in a referral. - Continue tamsulosin  0.4 mg/day

## 2024-03-23 NOTE — Assessment & Plan Note (Signed)
 Given palliative route for metastatic prostate cancer, discussed goals of care with patient and his fiance.  Per their wishes, patient would elect for full code at this time.  As the patient and his fiance are not married, also discussed the importance of a power of attorney as patient does not currently have one.  At this time, they would like to further discuss this for next steps
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
## 2024-03-23 NOTE — Assessment & Plan Note (Signed)
 Current treatment includes amlodipine  10 mg/day.  Blood pressure today is in the 130s/50s. He took his meds this morning.  He denies any signs or symptoms of orthostatics.  He was counseled on reach out to the clinic if these were to develop. - Continue amlodipine  10 mg/day

## 2024-03-23 NOTE — Patient Instructions (Signed)
 Thank you so much for coming to the clinic today!   We will see you in six months.   If you have any questions please feel free to the call the clinic at anytime at [phone]. It was a pleasure seeing you!  Best, Dr. Carolee Churchman

## 2024-03-29 DIAGNOSIS — N451 Epididymitis: Secondary | ICD-10-CM | POA: Diagnosis not present

## 2024-03-29 DIAGNOSIS — N453 Epididymo-orchitis: Secondary | ICD-10-CM | POA: Diagnosis not present

## 2024-03-29 DIAGNOSIS — R300 Dysuria: Secondary | ICD-10-CM | POA: Diagnosis not present

## 2024-03-30 NOTE — Addendum Note (Signed)
 Addended by: Matias Thurman L on: 03/30/2024 01:34 PM   Modules accepted: Level of Service

## 2024-03-30 NOTE — Progress Notes (Signed)
 Internal Medicine Clinic Attending  Case discussed with the resident at the time of the visit.  We reviewed the resident's history and exam and pertinent patient test results.  I agree with the assessment, diagnosis, and plan of care documented in the resident's note.

## 2024-04-12 ENCOUNTER — Encounter: Payer: Self-pay | Admitting: Podiatry

## 2024-04-12 ENCOUNTER — Ambulatory Visit (INDEPENDENT_AMBULATORY_CARE_PROVIDER_SITE_OTHER): Payer: 59 | Admitting: Podiatry

## 2024-04-12 DIAGNOSIS — M79674 Pain in right toe(s): Secondary | ICD-10-CM | POA: Diagnosis not present

## 2024-04-12 DIAGNOSIS — M79675 Pain in left toe(s): Secondary | ICD-10-CM | POA: Diagnosis not present

## 2024-04-12 DIAGNOSIS — I739 Peripheral vascular disease, unspecified: Secondary | ICD-10-CM

## 2024-04-12 DIAGNOSIS — B351 Tinea unguium: Secondary | ICD-10-CM | POA: Diagnosis not present

## 2024-04-19 NOTE — Progress Notes (Signed)
  Subjective:  Patient ID: [name], male    DOB: 01-17-1935,  MRN: [id number]  Alekzander Cardell presents to clinic today for: at risk foot care. Patient has h/o PAD and painful elongated mycotic toenails 1-5 bilaterally which are tender when wearing enclosed shoe gear. Pain is relieved with periodic professional debridement.  Chief Complaint  Patient presents with   rfc    Rm16/not diabetic/last pcp visit May 2025    PCP is Jayson Michael, MD.  Allergies  Allergen Reactions   Penicillins Other (See Comments)    Passed Out Has patient had a PCN reaction causing immediate rash, facial/tongue/throat swelling, SOB or lightheadedness with hypotension: Has patient had a PCN reaction causing severe rash involving mucus membranes or skin necrosis:  Has patient had a PCN reaction that required hospitalization  Has patient had a PCN reaction occurring within the last 10 years:     Review of Systems: Negative except as noted in the HPI.  Objective: No changes noted in today's physical examination. There were no vitals filed for this visit.  Kay Ricciuti is a pleasant 89 y.o. male WD, WN in NAD. AAO x 3.  Vascular Examination: Capillary refill time <3 seconds b/l LE. Palpable DP pulse(s) right foot Palpable PT pulse(s) right foot Diminished DP pulse(s) left foot. Diminished PT pulse(s) left foot. Pedal hair absent. No pain with calf compression b/l. No edema noted b/l LE. No ischemia or gangrene noted b/l LE. No cyanosis or clubbing noted b/l LE.Aaron Aas  Dermatological Examination: Pedal skin with normal turgor, texture and tone b/l. No open wounds. No interdigital macerations b/l. Toenails left great toe and 2-5 b/l thickened, discolored, dystrophic with subungual debris. There is pain on palpation to dorsal aspect of nailplates.  Right hallux with moderate amount of nailbed fungal debris mixed with hyperkeratosis. No underlying abscess.   Musculoskeletal:  Muscle strength 5/5 to all lower  extremity muscle groups bilaterally. No pain, crepitus or joint limitation noted with ROM bilateral LE. HAV with bunion deformity noted b/l LE.  Neurological Examination: Protective sensation intact with 10 gram monofilament b/l LE. Vibratory sensation intact b/l LE.   Assessment/Plan: 1. Pain due to onychomycosis of toenails of both feet   2. PAD (peripheral artery disease) (HCC)   Consent given for treatment. Patient examined. All patient's and/or POA's questions/concerns addressed on today's visit.Toenails 1-5 debrided in length and girth without incident. Continue soft, supportive shoe gear daily. Report any pedal injuries to medical professional. Call office if there are any questions/concerns. -Patient/POA to call should there be question/concern in the interim.   Return in about 3 months (around 07/13/2024).  Luella Sager, DPM      [facility] LOCATION: [address] [place] [address]                   Office [phone]   [facility] LOCATION: [address] [place], [place] [address] Office [phone]

## 2024-05-30 ENCOUNTER — Other Ambulatory Visit: Payer: Self-pay | Admitting: Podiatry

## 2024-05-30 DIAGNOSIS — B353 Tinea pedis: Secondary | ICD-10-CM

## 2024-05-31 ENCOUNTER — Ambulatory Visit: Payer: 59

## 2024-05-31 VITALS — Ht 71.5 in | Wt 155.0 lb

## 2024-05-31 DIAGNOSIS — Z0000 Encounter for general adult medical examination without abnormal findings: Secondary | ICD-10-CM

## 2024-05-31 NOTE — Patient Instructions (Signed)
 Mr. [name] , Thank you for taking time out of your busy schedule to complete your Annual Wellness Visit with me. I enjoyed our conversation and look forward to speaking with you again next year. I, as well as your care team,  appreciate your ongoing commitment to your health goals. Please review the following plan we discussed and let me know if I can assist you in the future. Your Game plan/ To Do List    Referrals: If you haven't heard from the office you've been referred to, please reach out to them at the phone provided.   Follow up Visits: Next Medicare AWV with our clinical staff: 06/06/2025 at 9:50 a.m. phone visit with Nurse Health Advisor   Have you seen your provider in the last 6 months (3 months if uncontrolled diabetes)? Yes Next Office Visit with your provider: Office will call patient to schedule  Clinician Recommendations:  Aim for 30 minutes of exercise or brisk walking, 6-8 glasses of water, and 5 servings of fruits and vegetables each day.       This is a list of the screening recommended for you and due dates:  Health Maintenance  Topic Date Due   Zoster (Shingles) Vaccine (1 of 2) Never done   COVID-19 Vaccine (4 - 2024-25 season) 07/04/2023   Flu Shot  06/02/2024   DTaP/Tdap/Td vaccine (3 - Td or Tdap) 04/06/2032   Pneumococcal Vaccine for age over 19  Completed   Hepatitis B Vaccine  Aged Out   HPV Vaccine  Aged Out   Meningitis B Vaccine  Aged Out    Advanced directives: (Declined) Advance directive discussed with you today. Even though you declined this today, please call our office should you change your mind, and we can give you the proper paperwork for you to fill out. Advance Care Planning is important because it:  [x]  Makes sure you receive the medical care that is consistent with your values, goals, and preferences  [x]  It provides guidance to your family and loved ones and reduces their decisional burden about whether or not they are making the right  decisions based on your wishes.  Follow the link provided in your after visit summary or read over the paperwork we have mailed to you to help you started getting your Advance Directives in place. If you need assistance in completing these, please reach out to us  so that we can help you!  See attachments for Preventive Care and Fall Prevention Tips.
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
## 2024-08-02 ENCOUNTER — Ambulatory Visit: Admitting: Podiatry

## 2024-08-02 ENCOUNTER — Encounter: Payer: Self-pay | Admitting: Podiatry

## 2024-08-02 DIAGNOSIS — M79675 Pain in left toe(s): Secondary | ICD-10-CM

## 2024-08-02 DIAGNOSIS — B351 Tinea unguium: Secondary | ICD-10-CM

## 2024-08-02 DIAGNOSIS — M79674 Pain in right toe(s): Secondary | ICD-10-CM | POA: Diagnosis not present

## 2024-08-02 DIAGNOSIS — I739 Peripheral vascular disease, unspecified: Secondary | ICD-10-CM | POA: Diagnosis not present

## 2024-08-06 NOTE — Progress Notes (Signed)
  Subjective:  Patient ID: [name], male    DOB: 10-31-1935,  MRN: [id number]  [name] presents to clinic today for at risk foot care. Patient has h/o PAD and painful mycotic toenails of both feet that are difficult to trim. Pain interferes with daily activities and wearing enclosed shoe gear comfortably.  Chief Complaint  Patient presents with   RFC     RFC Non diabetic toenail trim. LOV with PCP 12/2023   New problem(s): None.   PCP is Napoleon Limes, MD.  Review of Systems: Negative except as noted in the HPI.  Objective: No changes noted in today's physical examination. There were no vitals filed for this visit. [name] is a pleasant 89 y.o. male WD, WN in NAD. AAO x 3.  Vascular Examination: CFT less than 3 seconds. Pedal pulses palpable RLE; nonpalpable LLE. Digital hair absent. Skin temperature gradient warm to warn b/l. No ischemia or gangrene. No cyanosis or clubbing noted b/l. No edema noted b/l LE.   Neurological Examination: Sensation grossly intact b/l with 10 gram monofilament. Vibratory sensation intact b/l.  Dermatological Examination: Pedal skin thin, shiny and atrophic b/l. No open wounds. No interdigital macerations.   Toenails left hallux and 2-5 b/l thick, discolored, elongated with subungual debris and pain on dorsal palpation.   Right hallux with moderate amount of nailbed fungal debris mixed with hyperkeratosis. No underlying abscess.   Musculoskeletal:  Muscle strength 5/5 to all lower extremity muscle groups bilaterally. No pain, crepitus or joint limitation noted with ROM bilateral LE. HAV with bunion deformity noted b/l LE.  Assessment/Plan: 1. Pain due to onychomycosis of toenails of both feet   2. PAD (peripheral artery disease)   Patient was evaluated and treated. All patient's and/or POA's questions/concerns addressed on today's visit. Mycotic toenails 1-5 debrided in length and girth without incident. Treatment was provided by  assistant Andrez Manchester under my supervision. Continue soft, supportive shoe gear daily. Report any pedal injuries to medical professional. Call office if there are any quesitons/concerns.  Return in about 3 months (around 11/02/2024).  [name], DPM      [facility] LOCATION: [address], [place] [address]                   Office [phone]   [facility] LOCATION: [address] [place], [place] [address] Office [phone]

## 2024-08-28 ENCOUNTER — Other Ambulatory Visit: Payer: Self-pay

## 2024-08-28 DIAGNOSIS — Z8546 Personal history of malignant neoplasm of prostate: Secondary | ICD-10-CM

## 2024-08-28 MED ORDER — TAMSULOSIN HCL 0.4 MG PO CAPS
ORAL_CAPSULE | ORAL | 3 refills | Status: AC
Start: 2024-08-28 — End: ?

## 2024-08-28 NOTE — Telephone Encounter (Signed)
 Medication sent to pharmacy

## 2024-08-29 ENCOUNTER — Ambulatory Visit: Admitting: Podiatry

## 2024-08-29 VITALS — Ht 71.0 in | Wt 155.0 lb

## 2024-08-29 DIAGNOSIS — I739 Peripheral vascular disease, unspecified: Secondary | ICD-10-CM

## 2024-08-29 NOTE — Patient Instructions (Signed)
 Call to schedule your vascular testing:   [facility] Jeralene Mom. [facility] [address] [place],  [place]  [address]   Main: [phone]

## 2024-08-30 NOTE — Progress Notes (Signed)
  Subjective:  Patient ID: [name], male    DOB: 1935/04/01,  MRN: [id number]  Chief Complaint  Patient presents with   Nail Problem    RM 3 Patient is here for evaluation right hallux for possible removal of nail.    89 y.o. male presents with the above complaint. History confirmed with patient.  Continues to be painful and bleeds occasionally  Objective:  Physical Exam: Toes are cool with delayed capillary fill time has a palpable DP pulse nonpalpable PT pulse right hallux nail is dystrophic with verrucous appearing base  Assessment:   1. PAD (peripheral artery disease)      Plan:  Patient was evaluated and treated and all questions answered.  Discussed permanent matricectomy and right great toenail removal, he has a palpable DP pulse but has some evidence of clinical PAD.  I recommended checking his ABIs and TBI's with toe pressures.  Referral was sent for this and I will see him back following this for the procedure.  Would want to see a toe pressure greater than 40 or 54 adequate healing.  Return for call to schedule f/u after vascular testing for nail remova.

## 2024-09-05 ENCOUNTER — Ambulatory Visit (HOSPITAL_COMMUNITY)
Admission: RE | Admit: 2024-09-05 | Discharge: 2024-09-05 | Disposition: A | Discharge status: Home / Self Care | Source: Ambulatory Visit | Attending: Podiatry | Admitting: Podiatry

## 2024-09-05 DIAGNOSIS — I739 Peripheral vascular disease, unspecified: Secondary | ICD-10-CM | POA: Insufficient documentation

## 2024-09-05 LAB — VAS US ABI WITH/WO TBI
Left ABI: 0.9
Right ABI: 0.94

## 2024-10-12 ENCOUNTER — Ambulatory Visit: Admitting: Podiatry

## 2024-10-12 VITALS — Ht 71.0 in | Wt 155.0 lb

## 2024-10-12 DIAGNOSIS — B351 Tinea unguium: Secondary | ICD-10-CM | POA: Diagnosis not present

## 2024-10-12 MED ORDER — NEOMYCIN-POLYMYXIN-HC 3.5-10000-1 OT SUSP: OTIC | 0 refills | Status: AC

## 2024-10-17 ENCOUNTER — Encounter: Payer: Self-pay | Admitting: Podiatry

## 2024-10-17 NOTE — Progress Notes (Signed)
 Subjective:  Patient ID: Strummer Canipe, male    DOB: 07/08/1935,  MRN: [id number]  Chief Complaint  Patient presents with   Nail Problem    RM 10 Toenail removal on the right hallus ( thickened growth).    89 y.o. male presents with the above complaint. History confirmed with patient.  He completed circulation testing today returns for nail removal  Objective:  Physical Exam: Toes are cool with delayed capillary fill time has a palpable DP pulse nonpalpable PT pulse right hallux nail is dystrophic with verrucous appearing base     LOWER EXTREMITY DOPPLER STUDY  Patient Name:  Kurt Hoffmeier  Date of Exam:   09/05/2024 Medical Rec #: [id number]    Accession #:    [id number] Date of Birth: Apr 23, 1935    Patient Gender: M Patient Age:   44 years Exam Location:  [address] Procedure:      VAS US  ABI WITH/WO TBI Referring Phys: Serenity Fortner   --------------------------------------------------------------------------- -----   Indications: Claudication.  High Risk Factors: Hypertension, hyperlipidemia.    Comparison Study: None.  Performing Technologist: Garnette Rockers    Examination Guidelines: A complete evaluation includes at minimum, Doppler waveform signals and systolic blood pressure reading at the level of bilateral brachial, anterior tibial, and posterior tibial arteries, when vessel segments are accessible. Bilateral testing is considered an integral part of a complete examination. Photoelectric Plethysmograph (PPG) waveforms and toe systolic pressure readings are included as required and additional duplex testing as needed. Limited examinations for reoccurring indications may be performed as noted.    ABI Findings: +---------+------------------+-----+-----------+--------+ Right    Rt Pressure (mmHg)IndexWaveform   Comment  +---------+------------------+-----+-----------+--------+ Brachial 165                                         +---------+------------------+-----+-----------+--------+ PTA      152               0.92 biphasic            +---------+------------------+-----+-----------+--------+ DP       155               0.94 multiphasic         +---------+------------------+-----+-----------+--------+ Great Toe157               0.95 Normal              +---------+------------------+-----+-----------+--------+  +---------+------------------+-----+-----------+-------+ Left     Lt Pressure (mmHg)IndexWaveform   Comment +---------+------------------+-----+-----------+-------+ Brachial 160                                       +---------+------------------+-----+-----------+-------+ PTA      146               0.88 biphasic           +---------+------------------+-----+-----------+-------+ DP       149               0.90 multiphasic        +---------+------------------+-----+-----------+-------+ Great Toe116               0.70 Normal             +---------+------------------+-----+-----------+-------+  +-------+-----------+-----------+------------+------------+ ABI/TBIToday's ABIToday's TBIPrevious ABIPrevious TBI +-------+-----------+-----------+------------+------------+ Right  0.94       0.95                                +-------+-----------+-----------+------------+------------+  Left   0.9        0.7                                 +-------+-----------+-----------+------------+------------+    TOES Findings: +----------+---------------+--------+-------+ Right ToesPressure (mmHg)WaveformComment +----------+---------------+--------+-------+ 1st Digit                Normal          +----------+---------------+--------+-------+ 2nd Digit                Normal          +----------+---------------+--------+-------+ 3rd Digit                Normal          +----------+---------------+--------+-------+ 4th Digit                 Normal          +----------+---------------+--------+-------+ 5th Digit                Normal          +----------+---------------+--------+-------+     +---------+---------------+--------+-------+ Left ToesPressure (mmHg)WaveformComment +---------+---------------+--------+-------+ 1st Digit               Normal          +---------+---------------+--------+-------+ 2nd Digit               Normal          +---------+---------------+--------+-------+ 3rd Digit               Normal          +---------+---------------+--------+-------+ 4th Digit               Normal          +---------+---------------+--------+-------+ 5th Digit               Normal          +---------+---------------+--------+-------+          Summary: Right: Resting right ankle-brachial index is within normal range. The right toe-brachial index is normal.   Left: Resting left ankle-brachial index indicates mild left lower extremity arterial disease. The left toe-brachial index is normal.    *See table(s) above for measurements and observations.      Electronically signed by Maude Emmer MD on 09/05/2024 at 5:04:13 PM.       Final     Assessment:   1. Onychomycosis      Plan:  Patient was evaluated and treated and all questions answered.  We reviewed his circulation testing and his noninvasive testing shows adequate circulation for wound healing.  Discussed risks and benefits and potential complications of permanent matricectomy of the entirety of the right great toenail.  He understands and wishes to proceed.  All questions addressed.  After verbal consent the right hallux was anesthetized with Marcaine  and lidocaine  1.5 cc each.  It was prepped Betadine exsanguinated and a tourniquet secured around the base of the toe.  Utilizing tissue nipper and Freer elevator the right hallux nail plate was removed and the matrix and nailbed was exposed.  3  applications of phenolic acid were applied to destroy the nailbed.  It was irrigated there with alcohol and Silvadene and dry gauze dressings were applied.  Compression wrap applied and post care instructions given.  Cortisporin drops sent to pharmacy.  Follow-up with me in 4 weeks for reevaluation.  Advised on signs symptoms of  infection or nonhealing and to notify me immediately.  Return in about 26 days (around 11/07/2024) for nail re-check.

## 2024-11-07 ENCOUNTER — Ambulatory Visit (INDEPENDENT_AMBULATORY_CARE_PROVIDER_SITE_OTHER): Admitting: Podiatry

## 2024-11-07 VITALS — Ht 71.0 in | Wt 155.0 lb

## 2024-11-07 DIAGNOSIS — B351 Tinea unguium: Secondary | ICD-10-CM

## 2024-11-07 NOTE — Progress Notes (Signed)
 "  Subjective:  Patient ID: [name], male    DOB: 12-Sep-1935,  MRN: [id number]  Chief Complaint  Patient presents with   Nail Problem    RM 2 Patient is here to f/u toenail removal of right hallux. Pt states no pain or discomfort.    89 y.o. male presents with the above complaint. History confirmed with patient.  He is doing well without any sign of drainage or infection  Objective:  Physical Exam: Toes are cool with delayed capillary fill time has a palpable DP pulse nonpalpable PT pulse right hallux nail matricectomy site is healing well     LOWER EXTREMITY DOPPLER STUDY  Patient Name:  [name]  Date of Exam:   09/05/2024 Medical Rec #: [id number]    Accession #:    [id number] Date of Birth: Mar 08, 1935    Patient Gender: M Patient Age:   60 years Exam Location:  [address] Procedure:      VAS US  ABI WITH/WO TBI Referring Phys: Jarely Juncaj   --------------------------------------------------------------------------- -----   Indications: Claudication.  High Risk Factors: Hypertension, hyperlipidemia.    Comparison Study: None.  Performing Technologist: Garnette Rockers    Examination Guidelines: A complete evaluation includes at minimum, Doppler waveform signals and systolic blood pressure reading at the level of bilateral brachial, anterior tibial, and posterior tibial arteries, when vessel segments are accessible. Bilateral testing is considered an integral part of a complete examination. Photoelectric Plethysmograph (PPG) waveforms and toe systolic pressure readings are included as required and additional duplex testing as needed. Limited examinations for reoccurring indications may be performed as noted.    ABI Findings: +---------+------------------+-----+-----------+--------+ Right    Rt Pressure (mmHg)IndexWaveform   Comment  +---------+------------------+-----+-----------+--------+ Brachial 165                                         +---------+------------------+-----+-----------+--------+ PTA      152               0.92 biphasic            +---------+------------------+-----+-----------+--------+ DP       155               0.94 multiphasic         +---------+------------------+-----+-----------+--------+ Great Toe157               0.95 Normal              +---------+------------------+-----+-----------+--------+  +---------+------------------+-----+-----------+-------+ Left     Lt Pressure (mmHg)IndexWaveform   Comment +---------+------------------+-----+-----------+-------+ Brachial 160                                       +---------+------------------+-----+-----------+-------+ PTA      146               0.88 biphasic           +---------+------------------+-----+-----------+-------+ DP       149               0.90 multiphasic        +---------+------------------+-----+-----------+-------+ Great Toe116               0.70 Normal             +---------+------------------+-----+-----------+-------+  +-------+-----------+-----------+------------+------------+ ABI/TBIToday's ABIToday's TBIPrevious ABIPrevious TBI +-------+-----------+-----------+------------+------------+ Right  0.94  0.95                                +-------+-----------+-----------+------------+------------+ Left   0.9        0.7                                 +-------+-----------+-----------+------------+------------+    TOES Findings: +----------+---------------+--------+-------+ Right ToesPressure (mmHg)WaveformComment +----------+---------------+--------+-------+ 1st Digit                Normal          +----------+---------------+--------+-------+ 2nd Digit                Normal          +----------+---------------+--------+-------+ 3rd Digit                Normal          +----------+---------------+--------+-------+ 4th Digit                 Normal          +----------+---------------+--------+-------+ 5th Digit                Normal          +----------+---------------+--------+-------+     +---------+---------------+--------+-------+ Left ToesPressure (mmHg)WaveformComment +---------+---------------+--------+-------+ 1st Digit               Normal          +---------+---------------+--------+-------+ 2nd Digit               Normal          +---------+---------------+--------+-------+ 3rd Digit               Normal          +---------+---------------+--------+-------+ 4th Digit               Normal          +---------+---------------+--------+-------+ 5th Digit               Normal          +---------+---------------+--------+-------+          Summary: Right: Resting right ankle-brachial index is within normal range. The right toe-brachial index is normal.   Left: Resting left ankle-brachial index indicates mild left lower extremity arterial disease. The left toe-brachial index is normal.    *See table(s) above for measurements and observations.      Electronically signed by Maude Emmer MD on 09/05/2024 at 5:04:13 PM.       Final     Assessment:   1. Onychomycosis      Plan:  Patient was evaluated and treated and all questions answered.  Healing very well may discontinue soaks ointment and bandaging and leave open to air and should heal uneventfully at this point No follow-ups on file.   "

## 2024-11-14 ENCOUNTER — Ambulatory Visit: Admitting: Podiatry

## 2024-11-14 ENCOUNTER — Encounter: Payer: Self-pay | Admitting: Podiatry

## 2024-11-14 DIAGNOSIS — B351 Tinea unguium: Secondary | ICD-10-CM | POA: Diagnosis not present

## 2024-11-14 DIAGNOSIS — I739 Peripheral vascular disease, unspecified: Secondary | ICD-10-CM

## 2024-11-20 NOTE — Progress Notes (Signed)
"  °  Subjective:  Patient ID: [name], male    DOB: [birth year],  MRN: [id number]  [name] presents to clinic today for at risk foot care. Patient has h/o PAD and painful thick toenails that are difficult to trim. Pain interferes with ambulation. Aggravating factors include wearing enclosed shoe gear. Pain is relieved with periodic professional debridement. He is accompanied by his significant other on today's visit. He had right great toenail removed by [name]. Toe has healed well.  Chief Complaint  Patient presents with   Nail Problem    Cut my toenails off.   New problem(s): None.   PCP is Stuart, Melvenia, MD.  Allergies Allergen Reactions   Penicillins Other (See Comments)    Passed Out Has patient had a PCN reaction causing immediate rash, facial/tongue/throat swelling, SOB or lightheadedness with hypotension:  Has patient had a PCN reaction causing severe rash involving mucus membranes or skin necrosis:  Has patient had a PCN reaction that required hospitalization { Has patient had a PCN reaction occurring within the last 10 years:     Review of Systems: Negative except as noted in the HPI.  Objective: No changes noted in today's physical examination. There were no vitals filed for this visit. [name] is a pleasant 89 y.o. male WD, WN in NAD. AAO x 3.  Vascular Examination: CFT less than 3 seconds. Pedal pulses palpable RLE; nonpalpable LLE. Digital hair absent. Skin temperature gradient warm to warn b/l. No ischemia or gangrene. No cyanosis or clubbing noted b/l. No edema noted b/l LE.   Neurological Examination: Sensation grossly intact b/l with 10 gram monofilament. Vibratory sensation intact b/l.  Dermatological Examination: Pedal skin thin, shiny and atrophic b/l. No open wounds. No interdigital macerations.   Toenails left hallux and 2-5 b/l thick, discolored, elongated with subungual debris and pain on dorsal palpation.   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  Musculoskeletal:  Muscle strength 5/5 to all lower extremity muscle groups bilaterally. No pain, crepitus or joint limitation noted with ROM bilateral LE. HAV with bunion deformity noted b/l LE.  Assessment/Plan: 1. Onychomycosis   2. PAD (peripheral artery disease)    Consent given for treatment. Patient examined. All patient's and/or POA's questions/concerns addressed on today's visit.Toenails 2-5 bilaterally and L hallux  debrided in length and girth without incident.Continue soft, supportive shoe gear daily. Report any pedal injuries to medical professional. Call office if there are any questions/concerns.  Follow up in 3 months.  [name], DPM      [facility] LOCATION: [address] [place] [address]                   Office [phone]   [facility] LOCATION: [address] [place], [place] [address] Office [phone]   "

## 2025-02-20 ENCOUNTER — Ambulatory Visit: Admitting: Podiatry

## 2025-06-06 ENCOUNTER — Ambulatory Visit
